# Patient Record
Sex: Female | Born: 1980 | Hispanic: Yes | Marital: Married | Smoking: Never smoker
Health system: Southern US, Community
[De-identification: ages and names within clinical notes are randomized; demographics above are authoritative.]

## PROBLEM LIST (undated history)

## (undated) DIAGNOSIS — O24419 Gestational diabetes mellitus in pregnancy, unspecified control: Secondary | ICD-10-CM

## (undated) DIAGNOSIS — R7612 Nonspecific reaction to cell mediated immunity measurement of gamma interferon antigen response without active tuberculosis: Secondary | ICD-10-CM

---

## 2018-09-19 LAB — OB RESULTS CONSOLE VARICELLA ZOSTER ANTIBODY, IGG: Varicella: IMMUNE

## 2018-09-19 LAB — OB RESULTS CONSOLE PLATELET COUNT: Platelets: 293

## 2018-09-19 LAB — OB RESULTS CONSOLE GC/CHLAMYDIA
Chlamydia: NEGATIVE
Gonorrhea: NEGATIVE

## 2018-09-19 LAB — OB RESULTS CONSOLE RPR: RPR: NONREACTIVE

## 2018-09-19 LAB — OB RESULTS CONSOLE TSH: TSH: 1.84

## 2018-09-19 LAB — OB RESULTS CONSOLE ANTIBODY SCREEN: Antibody Screen: NEGATIVE

## 2018-09-19 LAB — OB RESULTS CONSOLE TB SKIN TEST: CHL TB SkinTest: POSITIVE

## 2018-09-19 LAB — SICKLE CELL SCREEN: Sickle Cell Screen: NEGATIVE

## 2018-09-19 LAB — OB RESULTS CONSOLE ABO/RH: RH Type: POSITIVE

## 2018-09-19 LAB — OB RESULTS CONSOLE HGB/HCT, BLOOD: Hemoglobin: 12.1

## 2018-09-19 LAB — OB RESULTS CONSOLE HIV ANTIBODY (ROUTINE TESTING): HIV: NONREACTIVE

## 2018-09-19 LAB — OB RESULTS CONSOLE HEPATITIS B SURFACE ANTIGEN: Hepatitis B Surface Ag: NEGATIVE

## 2018-09-22 ENCOUNTER — Other Ambulatory Visit: Payer: Self-pay | Admitting: Family Medicine

## 2018-09-22 DIAGNOSIS — O09899 Supervision of other high risk pregnancies, unspecified trimester: Secondary | ICD-10-CM

## 2018-09-22 DIAGNOSIS — I10 Essential (primary) hypertension: Secondary | ICD-10-CM

## 2018-09-22 DIAGNOSIS — O09219 Supervision of pregnancy with history of pre-term labor, unspecified trimester: Secondary | ICD-10-CM

## 2018-09-22 DIAGNOSIS — R030 Elevated blood-pressure reading, without diagnosis of hypertension: Secondary | ICD-10-CM

## 2018-09-26 ENCOUNTER — Other Ambulatory Visit: Payer: Self-pay | Admitting: Family Medicine

## 2018-09-26 DIAGNOSIS — Z369 Encounter for antenatal screening, unspecified: Secondary | ICD-10-CM

## 2018-09-26 DIAGNOSIS — R7612 Nonspecific reaction to cell mediated immunity measurement of gamma interferon antigen response without active tuberculosis: Secondary | ICD-10-CM

## 2018-09-29 ENCOUNTER — Ambulatory Visit
Admission: RE | Admit: 2018-09-29 | Discharge: 2018-09-29 | Disposition: A | Discharge status: Home / Self Care | Payer: Self-pay | Source: Ambulatory Visit | Attending: Maternal & Fetal Medicine | Admitting: Maternal & Fetal Medicine

## 2018-09-29 ENCOUNTER — Other Ambulatory Visit: Payer: Self-pay

## 2018-09-29 DIAGNOSIS — O09521 Supervision of elderly multigravida, first trimester: Secondary | ICD-10-CM

## 2018-09-29 NOTE — Progress Notes (Signed)
Virtual Visit via Telephone Note  I connected with Summer Johnson on Sep 29, 2018 at 10:00 AM EDT by telephone and verified that I am speaking with the correct person using two identifiers.  Referring Provider:  Federico Flake Length of Consultation: 45 minutes  Summer Johnson was referred to [facility] for genetic counseling because of advanced maternal age.  The patient will be 38 years old at the time of delivery.  This note summarizes the information we discussed with the aid of a Spanish interpreter.    We explained that the chance of a chromosome abnormality increases with maternal age.  Chromosomes and examples of chromosome problems were reviewed.  Humans typically have 46 chromosomes in each cell, with half passed through each sperm and egg.  Any change in the number or structure of chromosomes can increase the risk of problems in the physical and mental development of a pregnancy.   Based upon age of the patient, the chance of any chromosome abnormality was 1 in 61. The chance of Down syndrome, the most common chromosome problem associated with maternal age, was 1 in 32.  The risk of chromosome problems is in addition to the 3% general population risk for birth defects and mental retardation.  The greatest chance, of course, is that the baby would be born in good health.  We discussed the following prenatal screening and testing options for this pregnancy:  Cell free fetal DNA testing from maternal blood may be used to determine whether or not the baby may have either Down syndrome, trisomy 44, or trisomy 53.  This test utilizes a maternal blood sample and DNA sequencing technology to isolate circulating cell free fetal DNA from maternal plasma.  The fetal DNA can then be analyzed for DNA sequences that are derived from the three most common chromosomes involved in aneuploidy, chromosomes 13, 18, and 21.  If the overall amount of DNA is greater than the expected  level for any of these chromosomes, aneuploidy is suspected.  While we do not consider it a replacement for invasive testing and karyotype analysis, a negative result from this testing would be reassuring, though not a guarantee of a normal chromosome complement for the baby.  An abnormal result is certainly suggestive of an abnormal chromosome complement, though we would still recommend CVS or amniocentesis to confirm any findings from this testing.  Targeted ultrasound uses high frequency sound waves to create an image of the developing fetus.  An ultrasound is often recommended as a routine means of evaluating the pregnancy.  It is also used to screen for fetal anatomy problems (for example, a heart defect) that might be suggestive of a chromosomal or other abnormality.   The chorionic villus sampling procedure is available for first trimester chromosome analysis.  This involves the withdrawal of a small amount of chorionic villi (tissue from the developing placenta).  Risk of pregnancy loss is estimated to be approximately 1 in 200 to 1 in 100 (0.5 to 1%).  There is approximately a 1% (1 in 100) chance that the CVS chromosome results will be unclear.  Chorionic villi cannot be tested for neural tube defects.     Amniocentesis involves the removal of a small amount of amniotic fluid from the sac surrounding the fetus with the use of a thin needle inserted through the maternal abdomen and uterus.  Ultrasound guidance is used throughout the procedure.  Fetal cells from amniotic fluid are directly evaluated and > 99.5%  of chromosome problems and > 98% of open neural tube defects can be detected. This procedure is generally performed after the 15th week of pregnancy.  The main risks to this procedure include complications leading to miscarriage in less than 1 in 200 cases (0.5%).  First trimester screening and maternal serum screening were not reviewed in detail, as they are not being offered during the  COVID-19 timeframe.  We do recommend at AFP only for neural tube defects in the second trimester.   Cystic Fibrosis and Spinal Muscular Atrophy (SMA) screening were also discussed with the patient. Both conditions are recessive, which means that both parents must be carriers in order to have a child with the disease.  Cystic fibrosis (CF) is one of the most common genetic conditions in persons of Caucasian ancestry.  This condition occurs in approximately 1 in 2,500 Caucasian persons and results in thickened secretions in the lungs, digestive, and reproductive systems.  For a baby to be at risk for having CF, both of the parents must be carriers for this condition.  Approximately 1 in 4125 Caucasian persons is a carrier for CF.  Current carrier testing looks for the most common mutations in the gene for CF and can detect approximately 90% of carriers in the Caucasian population.  This means that the carrier screening can greatly reduce, but cannot eliminate, the chance for an individual to have a child with CF.  If an individual is found to be a carrier for CF, then carrier testing would be available for the partner. As part of [place]'s newborn screening profile, all babies born in the state of [place]  will have a two-tier screening process.  Specimens are first tested to determine the concentration of immunoreactive trypsinogen (IRT).  The top 5% of specimens with the highest IRT values then undergo DNA testing using a panel of over 40 common CF mutations. SMA is a neurodegenerative disorder that leads to atrophy of skeletal muscle and overall weakness.  This condition is also more prevalent in the Caucasian population, with 1 in 40-1 in 60 persons being a carrier and 1 in 6,000-1 in 10,000 children being affected.  There are multiple forms of the disease, with some causing death in infancy to other forms with survival into adulthood.  The genetics of SMA is complex, but carrier screening can detect  up to 95% of carriers in the Caucasian population.  Similar to CF, a negative result can greatly reduce, but cannot eliminate, the chance to have a child with SMA. We also discussed the option of carrier screening for hemoglobinopathies.  The patient and her partner are of [place] ancestry.  We obtained a detailed family history and pregnancy history.  The family history is unremarkable for birth defects, developmental delays, recurrent pregnancy loss or known chromosome abnormalities.  Summer Johnson stated that this is the fourth pregnancy with her husband.  She reported no complications or exposures in this pregnancy that would be expected to increase the risk for birth defects.  After consideration of the options, Summer Johnson elected to proceed with an ultrasound only and to declined aneuploidy testing as well as carrier testing.  An ultrasound is scheduled for Monday, May 11 at 1[facility]00pm [facility] for dating and viability.  An ultrasound will also be scheduled at 18 weeks for anatomy.  Please refer to the ultrasound report for details of that study.  Summer Johnson was encouraged to call with questions or concerns.  We can be contacted  at [phone].   Tests Ordered: none - patient declined all testing other than ultrasound  I provided 45 minutes of non-face-to-face time during this encounter.   Katrina Stack

## 2018-10-03 ENCOUNTER — Other Ambulatory Visit: Payer: Self-pay

## 2018-10-03 ENCOUNTER — Ambulatory Visit: Payer: Self-pay

## 2018-10-03 ENCOUNTER — Ambulatory Visit
Admission: RE | Admit: 2018-10-03 | Discharge: 2018-10-03 | Disposition: A | Discharge status: Home / Self Care | Payer: Self-pay | Source: Ambulatory Visit | Attending: Maternal & Fetal Medicine | Admitting: Maternal & Fetal Medicine

## 2018-10-03 DIAGNOSIS — Z369 Encounter for antenatal screening, unspecified: Secondary | ICD-10-CM | POA: Insufficient documentation

## 2018-10-03 DIAGNOSIS — Z3A13 13 weeks gestation of pregnancy: Secondary | ICD-10-CM | POA: Insufficient documentation

## 2018-10-03 NOTE — Progress Notes (Signed)
Pt seen by me today, history reviewed by me, agree with assessment and plan as outlined in [facility]'s note.

## 2018-10-11 ENCOUNTER — Ambulatory Visit
Admission: RE | Admit: 2018-10-11 | Discharge: 2018-10-11 | Disposition: A | Discharge status: Home / Self Care | Payer: MEDICAID | Attending: Family Medicine | Admitting: Family Medicine

## 2018-10-11 ENCOUNTER — Ambulatory Visit
Admission: RE | Admit: 2018-10-11 | Discharge: 2018-10-11 | Disposition: A | Discharge status: Home / Self Care | Payer: MEDICAID | Source: Ambulatory Visit | Attending: Family Medicine | Admitting: Family Medicine

## 2018-10-11 DIAGNOSIS — R7612 Nonspecific reaction to cell mediated immunity measurement of gamma interferon antigen response without active tuberculosis: Secondary | ICD-10-CM

## 2018-11-03 ENCOUNTER — Other Ambulatory Visit: Payer: Self-pay

## 2018-11-03 DIAGNOSIS — O09521 Supervision of elderly multigravida, first trimester: Secondary | ICD-10-CM

## 2018-11-07 ENCOUNTER — Other Ambulatory Visit: Payer: Self-pay

## 2018-11-07 ENCOUNTER — Ambulatory Visit
Admission: RE | Admit: 2018-11-07 | Discharge: 2018-11-07 | Disposition: A | Discharge status: Home / Self Care | Payer: Medicaid Other | Source: Ambulatory Visit | Attending: Maternal & Fetal Medicine | Admitting: Maternal & Fetal Medicine

## 2018-11-07 DIAGNOSIS — Z3A18 18 weeks gestation of pregnancy: Secondary | ICD-10-CM | POA: Insufficient documentation

## 2018-11-07 DIAGNOSIS — O09522 Supervision of elderly multigravida, second trimester: Secondary | ICD-10-CM | POA: Diagnosis not present

## 2018-11-07 DIAGNOSIS — Z3689 Encounter for other specified antenatal screening: Secondary | ICD-10-CM | POA: Insufficient documentation

## 2018-11-07 DIAGNOSIS — O09521 Supervision of elderly multigravida, first trimester: Secondary | ICD-10-CM

## 2018-12-05 ENCOUNTER — Encounter: Payer: Self-pay | Admitting: Family Medicine

## 2018-12-05 DIAGNOSIS — Z6830 Body mass index (BMI) 30.0-30.9, adult: Secondary | ICD-10-CM | POA: Insufficient documentation

## 2018-12-05 DIAGNOSIS — R7612 Nonspecific reaction to cell mediated immunity measurement of gamma interferon antigen response without active tuberculosis: Secondary | ICD-10-CM | POA: Insufficient documentation

## 2018-12-05 DIAGNOSIS — E669 Obesity, unspecified: Secondary | ICD-10-CM | POA: Insufficient documentation

## 2018-12-05 DIAGNOSIS — O99210 Obesity complicating pregnancy, unspecified trimester: Secondary | ICD-10-CM

## 2018-12-05 DIAGNOSIS — O0990 Supervision of high risk pregnancy, unspecified, unspecified trimester: Secondary | ICD-10-CM | POA: Insufficient documentation

## 2019-01-03 ENCOUNTER — Other Ambulatory Visit: Payer: Self-pay

## 2019-01-03 ENCOUNTER — Observation Stay
Admission: EM | Admit: 2019-01-03 | Discharge: 2019-01-03 | Disposition: A | Discharge status: Home / Self Care | Payer: Medicaid Other | Attending: Obstetrics and Gynecology | Admitting: Obstetrics and Gynecology

## 2019-01-03 DIAGNOSIS — R102 Pelvic and perineal pain: Secondary | ICD-10-CM | POA: Insufficient documentation

## 2019-01-03 DIAGNOSIS — O26892 Other specified pregnancy related conditions, second trimester: Principal | ICD-10-CM | POA: Insufficient documentation

## 2019-01-03 DIAGNOSIS — Z3490 Encounter for supervision of normal pregnancy, unspecified, unspecified trimester: Secondary | ICD-10-CM

## 2019-01-03 DIAGNOSIS — O0990 Supervision of high risk pregnancy, unspecified, unspecified trimester: Secondary | ICD-10-CM

## 2019-01-03 DIAGNOSIS — O99210 Obesity complicating pregnancy, unspecified trimester: Secondary | ICD-10-CM

## 2019-01-03 DIAGNOSIS — Z3A26 26 weeks gestation of pregnancy: Secondary | ICD-10-CM | POA: Insufficient documentation

## 2019-01-03 LAB — URINALYSIS, ROUTINE W REFLEX MICROSCOPIC
Bilirubin Urine: NEGATIVE
Glucose, UA: NEGATIVE mg/dL
Hgb urine dipstick: NEGATIVE
Ketones, ur: NEGATIVE mg/dL
Leukocytes,Ua: NEGATIVE
Nitrite: NEGATIVE
Protein, ur: NEGATIVE mg/dL
Specific Gravity, Urine: 1.016 (ref 1.005–1.030)
pH: 7 (ref 5.0–8.0)

## 2019-01-03 NOTE — OB Triage Note (Signed)
Pt presents c/o frequent urination without pain or burning. She states that it felt like she was leaking fluid by itself. Reports having bloody mucous vaginal discharge. Denies any current bleeding. Reports lower abdominal cramps rating them 5/10. Last intercourse was 01/01/19. Reports positive fetal movement. Vitals WNL. Will continue to monitor. Reports increased vaginal pressure when walking as well.

## 2019-01-05 ENCOUNTER — Other Ambulatory Visit: Payer: Self-pay

## 2019-01-05 ENCOUNTER — Encounter: Payer: Self-pay | Admitting: Physician Assistant

## 2019-01-05 ENCOUNTER — Ambulatory Visit: Payer: Medicaid Other | Admitting: Physician Assistant

## 2019-01-05 VITALS — BP 95/72 | Temp 97.4°F | Wt 190.6 lb

## 2019-01-05 DIAGNOSIS — Z1331 Encounter for screening for depression: Secondary | ICD-10-CM | POA: Insufficient documentation

## 2019-01-05 DIAGNOSIS — O09522 Supervision of elderly multigravida, second trimester: Secondary | ICD-10-CM

## 2019-01-05 DIAGNOSIS — O99210 Obesity complicating pregnancy, unspecified trimester: Secondary | ICD-10-CM

## 2019-01-05 DIAGNOSIS — O0990 Supervision of high risk pregnancy, unspecified, unspecified trimester: Secondary | ICD-10-CM

## 2019-01-05 DIAGNOSIS — R7612 Nonspecific reaction to cell mediated immunity measurement of gamma interferon antigen response without active tuberculosis: Secondary | ICD-10-CM

## 2019-01-05 NOTE — Progress Notes (Signed)
Denies s/s or exposure to Covid-19. Taking PNV and ASA QD. PTL s/s written info today. Undecided regarding PP BCM, has info. L&D hospital visit 01/03/2019 for  lower abdominal pressure and pain that sometimes causes her difficulty in walking.  Hal Morales, RN

## 2019-01-05 NOTE — Progress Notes (Signed)
   PRENATAL VISIT NOTE  Subjective:  Summer Johnson is a 38 y.o. G4P3003 at [redacted]w[redacted]d being seen today for ongoing prenatal care.  She is currently monitored for the following issues for this high-risk pregnancy and has Advanced maternal age in multigravida, first trimester; Supervision of high risk pregnancy, antepartum; Obesity affecting pregnancy, antepartum; Morbid obesity (Jacobus); Positive QuantiFERON-TB Gold test; Pregnancy; and Depression screen on their problem list.  Patient reports occ urinary leakage.  Contractions: Not present. Vag. Bleeding: None.  Movement: Present. Denies leaking of fluid/ROM.   The following portions of the patient's history were reviewed and updated as appropriate: allergies, current medications, past family history, past medical history, past social history, past surgical history and problem list. Problem list updated.  Objective:   Vitals:   01/05/19 1440  BP: 95/72  Temp: (!) 97.4 F (36.3 C)  Weight: 190 lb 9.6 oz (86.5 kg)    Fetal Status: Fetal Heart Rate (bpm): 152 Fundal Height: 27 cm Movement: Present     General:  Alert, oriented and cooperative. Patient is in no acute distress.  Skin: Skin is warm and dry. No rash noted.         Abdomen: Soft, gravid, appropriate for gestational age.  Pain/Pressure: Absent     Pelvic: Cervical exam deferred        Extremities: Normal range of motion.  Edema: None  Mental Status: Normal mood and affect. Normal behavior. Normal judgment and thought content.   Assessment and Plan:  Pregnancy: G4P3003 at [redacted]w[redacted]d  1. Supervision of high risk pregnancy, antepartum Enc to keep 01/16/19 fetal US as sched to complete anatomical survey and evaluate growth. Enc po hydration, but avoid caffeinated beverages that might be irritating bladder/causing urinary frequency. Anticipatory guidance re: 28-wk labs.   2. Obesity affecting pregnancy, antepartum Continue aspirin, encouraged to take every day.  3. Positive QuantiFERON-TB  Gold test CXR neg; Offered treatment for LTBI - pt prefers to wait until postpartum  4. Depression screen PHQ-9 pos at score of 8. Offered counseling - pt declines for now, but will consider. Seems upbeat during visit.    Preterm labor symptoms and general obstetric precautions including but not limited to vaginal bleeding, contractions, leaking of fluid and fetal movement were reviewed in detail with the patient. Please refer to After Visit Summary for other counseling recommendations.  Return in about 2 weeks (around 01/19/2019) for Routine prenatal care, 28 wk labs.  Future Appointments  Date Time Provider [place]  01/16/2019  1:00 PM [facility] [place] [facility]  01/19/2019  1:40 PM [facility] PROVIDER [facility] None    Lora Havens, PA-C

## 2019-01-07 NOTE — Discharge Summary (Signed)
    L&D OB Triage Note  SUBJECTIVE Summer Johnson is a 38 y.o. [id number] female at [redacted]w[redacted]d, EDD Estimated Date of Delivery: 04/10/19 who presented to triage with complaints of pelvic pressure.  Possible leakage.   OB History  Gravida Para Term Preterm AB Living  4 3 3  0 0 3  SAB TAB Ectopic Multiple Live Births  0 0 0 0 3    # Outcome Date GA Lbr Len/2nd Weight Sex Delivery Anes PTL Lv  4 Current           3 Term 02/27/07 [redacted]w[redacted]d  3175 g F Vag-Spont   LIV     Name: Luanna Salk  2 Term 10/25/05 [redacted]w[redacted]d  [address]   LIV     Name: Diego  1 Term 09/07/03 [redacted]w[redacted]d  2722 g F Vag-Spont   LIV     Name: Raquel Sarna    No medications prior to admission.     OBJECTIVE  Nursing Evaluation:   BP 108/66 (BP Location: Left Arm)   Pulse 72   Temp 98.2 F (36.8 C) (Oral)   Resp 16   Ht 5' (1.524 m)   Wt 75.3 kg   LMP 07/04/2018   SpO2 99%   BMI 32.42 kg/m    Findings:   Rare contractions.  Not in labor.  Urine normal.  NST was performed and has been reviewed by me.  NST INTERPRETATION: Category I  Mode: External Baseline Rate (A): 135 bpm Variability: Moderate Accelerations: 15 x 15 Decelerations: None     Contraction Frequency (min): ctx x 2  ASSESSMENT Impression:  1.  Pregnancy:  [id number] at [redacted]w[redacted]d , EDD Estimated Date of Delivery: 04/10/19 2.  NST:  Category I  PLAN 1. Reassurance given 2. Discharge home with standard labor precautions given to return to L&D or call the office for problems. 3. Continue routine prenatal care.

## 2019-01-12 ENCOUNTER — Other Ambulatory Visit: Payer: Self-pay

## 2019-01-12 ENCOUNTER — Other Ambulatory Visit: Payer: Self-pay | Admitting: Maternal & Fetal Medicine

## 2019-01-12 DIAGNOSIS — R638 Other symptoms and signs concerning food and fluid intake: Secondary | ICD-10-CM

## 2019-01-16 ENCOUNTER — Other Ambulatory Visit: Payer: Self-pay

## 2019-01-16 ENCOUNTER — Inpatient Hospital Stay
Admission: RE | Admit: 2019-01-16 | Discharge: 2019-01-16 | DRG: 832 | Disposition: A | Discharge status: Other Acute Inpatient Hospital | Payer: Medicaid Other | Attending: Obstetrics and Gynecology | Admitting: Obstetrics and Gynecology

## 2019-01-16 ENCOUNTER — Ambulatory Visit
Admission: RE | Admit: 2019-01-16 | Discharge: 2019-01-16 | Disposition: A | Discharge status: Home / Self Care | Payer: Medicaid Other | Source: Ambulatory Visit | Attending: Obstetrics and Gynecology | Admitting: Obstetrics and Gynecology

## 2019-01-16 DIAGNOSIS — R638 Other symptoms and signs concerning food and fluid intake: Secondary | ICD-10-CM

## 2019-01-16 DIAGNOSIS — Z20828 Contact with and (suspected) exposure to other viral communicable diseases: Secondary | ICD-10-CM | POA: Diagnosis present

## 2019-01-16 DIAGNOSIS — O99213 Obesity complicating pregnancy, third trimester: Secondary | ICD-10-CM | POA: Diagnosis present

## 2019-01-16 DIAGNOSIS — Z3A28 28 weeks gestation of pregnancy: Secondary | ICD-10-CM | POA: Diagnosis not present

## 2019-01-16 DIAGNOSIS — O42913 Preterm premature rupture of membranes, unspecified as to length of time between rupture and onset of labor, third trimester: Principal | ICD-10-CM | POA: Diagnosis present

## 2019-01-16 DIAGNOSIS — E669 Obesity, unspecified: Secondary | ICD-10-CM | POA: Diagnosis present

## 2019-01-16 DIAGNOSIS — O09523 Supervision of elderly multigravida, third trimester: Secondary | ICD-10-CM

## 2019-01-16 DIAGNOSIS — O321XX0 Maternal care for breech presentation, not applicable or unspecified: Secondary | ICD-10-CM | POA: Diagnosis present

## 2019-01-16 DIAGNOSIS — O4103X0 Oligohydramnios, third trimester, not applicable or unspecified: Secondary | ICD-10-CM | POA: Diagnosis present

## 2019-01-16 LAB — CBC
HCT: 36.1 % (ref 36.0–46.0)
Hemoglobin: 12 g/dL (ref 12.0–15.0)
MCH: 28.8 pg (ref 26.0–34.0)
MCHC: 33.2 g/dL (ref 30.0–36.0)
MCV: 86.6 fL (ref 80.0–100.0)
Platelets: 276 10*3/uL (ref 150–400)
RBC: 4.17 MIL/uL (ref 3.87–5.11)
RDW: 12.8 % (ref 11.5–15.5)
WBC: 9.1 10*3/uL (ref 4.0–10.5)
nRBC: 0 % (ref 0.0–0.2)

## 2019-01-16 LAB — TYPE AND SCREEN
ABO/RH(D): B POS
Antibody Screen: NEGATIVE

## 2019-01-16 LAB — CHLAMYDIA/NGC RT PCR (ARMC ONLY)
Chlamydia Tr: NOT DETECTED
N gonorrhoeae: NOT DETECTED

## 2019-01-16 LAB — MAGNESIUM: Magnesium: 3.9 mg/dL — ABNORMAL HIGH (ref 1.7–2.4)

## 2019-01-16 LAB — GROUP B STREP BY PCR: Group B strep by PCR: NEGATIVE

## 2019-01-16 LAB — SARS CORONAVIRUS 2 BY RT PCR (HOSPITAL ORDER, PERFORMED IN ~~LOC~~ HOSPITAL LAB): SARS Coronavirus 2: NEGATIVE

## 2019-01-16 MED ORDER — ACETAMINOPHEN 325 MG PO TABS: 650.0000 mg | ORAL_TABLET | ORAL | Status: DC | PRN

## 2019-01-16 MED ORDER — LACTATED RINGERS IV SOLN
INTRAVENOUS | Status: DC
  Administered 2019-01-16: 15:00:00 via INTRAVENOUS

## 2019-01-16 MED ORDER — MAGNESIUM SULFATE 40 G IN LACTATED RINGERS - SIMPLE
2.0000 g/h | INTRAVENOUS | Status: DC
  Filled 2019-01-16: qty 500

## 2019-01-16 MED ORDER — MAGNESIUM SULFATE BOLUS VIA INFUSION
4.0000 g | Freq: Once | INTRAVENOUS | Status: AC
  Administered 2019-01-16: 4 g via INTRAVENOUS
  Filled 2019-01-16: qty 500

## 2019-01-16 MED ORDER — AZITHROMYCIN 500 MG PO TABS
1000.0000 mg | ORAL_TABLET | Freq: Once | ORAL | Status: AC
  Administered 2019-01-16: 1000 mg via ORAL
  Filled 2019-01-16: qty 2

## 2019-01-16 MED ORDER — SOD CITRATE-CITRIC ACID 500-334 MG/5ML PO SOLN: 30.0000 mL | ORAL | Status: DC | PRN

## 2019-01-16 MED ORDER — LACTATED RINGERS IV SOLN: 500.0000 mL | INTRAVENOUS | Status: DC | PRN

## 2019-01-16 MED ORDER — OXYTOCIN 40 UNITS IN NORMAL SALINE INFUSION - SIMPLE MED: 2.5000 [IU]/h | INTRAVENOUS | Status: DC

## 2019-01-16 MED ORDER — OXYTOCIN BOLUS FROM INFUSION: 500.0000 mL | Freq: Once | INTRAVENOUS | Status: DC

## 2019-01-16 MED ORDER — AMOXICILLIN 500 MG PO CAPS
500.0000 mg | ORAL_CAPSULE | Freq: Three times a day (TID) | ORAL | Status: DC
  Filled 2019-01-16: qty 1

## 2019-01-16 MED ORDER — ONDANSETRON HCL 4 MG/2ML IJ SOLN: 4.0000 mg | Freq: Four times a day (QID) | INTRAMUSCULAR | Status: DC | PRN

## 2019-01-16 MED ORDER — SODIUM CHLORIDE 0.9 % IV SOLN
2.0000 g | Freq: Four times a day (QID) | INTRAVENOUS | Status: DC
  Administered 2019-01-16: 2 g via INTRAVENOUS
  Filled 2019-01-16: qty 2000

## 2019-01-16 MED ORDER — BETAMETHASONE SOD PHOS & ACET 6 (3-3) MG/ML IJ SUSP
INTRAMUSCULAR | Status: AC
  Filled 2019-01-16: qty 5

## 2019-01-16 MED ORDER — BETAMETHASONE SOD PHOS & ACET 6 (3-3) MG/ML IJ SUSP
12.0000 mg | INTRAMUSCULAR | Status: DC
  Administered 2019-01-16: 15:00:00 12 mg via INTRAMUSCULAR

## 2019-01-16 MED ORDER — LIDOCAINE HCL (PF) 1 % IJ SOLN: 30.0000 mL | INTRAMUSCULAR | Status: DC | PRN

## 2019-01-16 NOTE — Discharge Summary (Signed)
[name] MRN: [id number] DOB/AGE: Jan 09, 1981 38 y.o.  Admit date: 01/16/2019 Discharge date: 01/16/2019  Admission Diagnoses: PPROM  Discharge Diagnoses: PPROM  Prenatal Procedures: NST and ultrasound  Consults: Neonatology, Maternal Fetal Medicine  Preg c/b: 1. PPROM on 01/08/19 per pt. No fluid measurable on growth [place].  2. Advanced maternal age 38. Incomplete anatomy scan for sacral views- repeat US 01/16/19: breech, no fluid, EFW 1100grams 4. Obesity, BMI 39; on baby ASA daily  Significant Diagnostic Studies:  Results for orders placed or performed during the hospital encounter of 01/16/19 (from the past 168 hour(s))  CBC   Collection Time: 01/16/19  2:18 PM  Result Value Ref Range   WBC 9.1 4.0 - 10.5 K/uL   RBC 4.17 3.87 - 5.11 MIL/uL   Hemoglobin 12.0 12.0 - 15.0 g/dL   HCT 36.1 36.0 - 46.0 %   MCV 86.6 80.0 - 100.0 fL   MCH 28.8 26.0 - 34.0 pg   MCHC 33.2 30.0 - 36.0 g/dL   RDW 12.8 11.5 - 15.5 %   Platelets 276 150 - 400 K/uL   nRBC 0.0 0.0 - 0.2 %  Type and screen [place]   Collection Time: 01/16/19  2:18 PM  Result Value Ref Range   ABO/RH(D) B POS    Antibody Screen NEG    Sample Expiration      01/19/2019,2359 Performed at [facility], [address] [place], [place] [address]   SARS Coronavirus 2 [facility] order, Performed in [place] hospital lab) Nasopharyngeal Nasopharyngeal Swab   Collection Time: 01/16/19  2:36 PM   Specimen: Nasopharyngeal Swab  Result Value Ref Range   SARS Coronavirus 2 NEGATIVE NEGATIVE  Group B strep by PCR   Collection Time: 01/16/19  2:50 PM   Specimen: Nasopharyngeal Swab; Genital  Result Value Ref Range   Group B strep by PCR NEGATIVE NEGATIVE    Treatments: antibiotics: azithromycin and Ampicillin, steroids: Betamethasone and Magnesium for neuroprotection  Hospital Course:  This is a 38 y.o. [id number] with IUP at [redacted]w[redacted]d admitted for PPROM on approx 8/16 occurring at  [redacted]w[redacted]d. SSE was performed, confirmed ROM.  She was initially started on magnesium sulfate for neuroprotection and also received betamethasone x 2 doses.  Her tocolysis was transitioned to Procardia. She was seen by Neonatology during her stay.  She was observed, fetal heart rate monitoring remained reassuring, and she had no signs/symptoms of progressing preterm labor or other maternal-fetal concerns.  Her cervical exam was unchanged from admission.  She was deemed stable for discharge to home with outpatient follow up.  Discharge Physical Exam:  BP 131/73   Pulse (!) 152   LMP 07/04/2018   General: NAD CV: RRR Pulm: CTABL, nl effort ABD: s/nd/nt, gravid DVT Evaluation: LE non-ttp, no evidence of DVT on exam. SSE: + pooling, clear fluid- spec exam not repeated at DC.  SVE: FTP/long/soft/posterior FHT: Cat I tracing, 130bpm, mod variability, + accels, no decels TOCO: irreg occasional Uc noted.    Discharge Condition: Stable for transport  Disposition: transfer to [place] via [facility] transport   Allergies as of 01/16/2019   No Known Allergies     Medication List    STOP taking these medications   aspirin EC 81 MG tablet   prenatal multivitamin Tabs tablet        Signed:  Francetta Found, CNM 01/16/2019  4:12 PM

## 2019-01-16 NOTE — OB Triage Note (Signed)
Pt sent up from [facility], 28 wk, LOF, U/S confirms no fluid and breech presentation.

## 2019-01-16 NOTE — H&P (Signed)
OB History & Physical   History of Present Illness:  Chief Complaint: sent from MFM due to low fluid.   HPI:  Summer Johnson is a 38 y.o. [phone] female at 68w0ddated by LMP and c/w 13wks [place]  She presents to L&D for oligohydramnios on [place] at MFM today.  - pt reports leaking fluid starting 8 days ago, felt that it was normal.  - seen today for routine growth [place] MFM, no measurable fluid noted, fetus breech.  - reports active FM, no VB.  - reports some cramping about 1.5wks ago, but none currently or since her water broke.   Pregnancy Issues: 1. PPROM on 01/08/19 per pt. No fluid measurable on growth [place]  2. Advanced maternal age 38 Incomplete anatomy scan for sacral views- repeat [place]8/24/20: breech, no fluid, EFW 1100grams 4. Obesity, BMI 39; on baby ASA daily   Maternal Medical History:  No past medical history on file.  No past surgical history on file.  No Known Allergies  Prior to Admission medications   Medication Sig Start Date End Date Taking? Authorizing Provider  aspirin EC 81 MG tablet Take 81 mg by mouth daily.    [provider]  Prenatal Vit-Fe Fumarate-FA (PRENATAL MULTIVITAMIN) TABS tablet Take 1 tablet by mouth daily at 12 noon.    [provider]   OB History  Gravida Para Term Preterm AB Living  _0 SAB TAB Ectopic Multiple Live Births          3    # Outcome Date GA Lbr Len/2nd Weight Sex Delivery Anes PTL Lv  4 Current           3 Term 02/27/07 [id number] g F Vag-Spont   LIV  2 Term 10/25/05 [id number] g M Vag-Spont   LIV  1 Term 09/07/03 40w29w0d22 g F Vag-Spont   LIV     [facility]   Social History: She  reports that she has never smoked. She has never used smokeless tobacco. She reports that she does not drink alcohol or use drugs.  Family History: family history includes Diabetes in her mother and sister; Hypertension in her mother.   Review of Systems: A full review of  systems was performed and negative except as noted in the HPI.     Physical Exam:  Vital Signs: BP 105/60 (BP Location: Right Arm)   Pulse 82   LMP 07/04/2018  General: no acute distress.  HEENT: normocephalic, atraumatic Heart: regular rate & rhythm.  No murmurs/rubs/gallops Lungs: clear to auscultation bilaterally, normal respiratory effort Abdomen: soft, gravid, non-tender;  EFW: 1100 grams by US t[place]ay.  Pelvic: SSE done: copious amt clear fluid, + pooling.   External: Normal external female genitalia  Cervix: lax vaginal tone noted, cervix digitally checked: FT/long, soft.    Extremities: non-tender, symmetric, no edema bilaterally.  DTRs: 2+  Neurologic: Alert & oriented x 3.    Results for orders placed or performed during the hospital encounter of 01/16/19 (from the past 24 hour(s))  CBC     Status: None   Collection Time: 01/16/19  2:18 PM  Result Value Ref Range   WBC 9.1 4.0 - 10.5 K/uL   RBC 4.17 3.87 - 5.11 MIL/uL   Hemoglobin 12.0 12.0 - 15.0 g/dL   HCT 36.1 36.0 - 46.0 %   MCV 86.6 80.0 - 100.0 fL  MCH 28.8 26.0 - 34.0 pg   MCHC 33.2 30.0 - 36.0 g/dL   RDW 12.8 11.5 - 15.5 %   Platelets 276 150 - 400 K/uL   nRBC 0.0 0.0 - 0.2 %    Pertinent Results:  Prenatal Labs: Blood type/Rh  B Pos  Antibody screen neg  Rubella  MMR x 2  Varicella Immune  RPR NR  HBsAg Neg  HIV NR  GC/CT  8/24- pending  Genetic screening  quad screen neg 11/11/18  1 hour GTT  early GTT 87  GBS  8/24- pending  S/p flu vaccine: 09/19/18 Tdap: not given this preg  FHT: 135bpm, moderate variability, + accels, no decels TOCO: Irreg Occasional UCs.    Breech by [place]  [place] Mfm Ob Follow Up  Result Date: 01/16/2019 ----------------------------------------------------------------------  OBSTETRICS REPORT                       (Signed Final 01/16/2019 02:09 pm) ---------------------------------------------------------------------- PATIENT INFO:  ID #:       [id number]                           D.O.B.:  [birth year] (37 yrs)  Name:       Summer Johnson                     Visit Date: 01/16/2019 01:51 pm ---------------------------------------------------------------------- PERFORMED BY:  Performed By:     Marinus Maw            Secondary Phy.:   ELIZABETH A                    Sonographer                                                             [place] CNM  Referred By:      Real Cons            Address:          [address],                                                             [place], [place]  Ref. Address:     [address]                    [place], [place] ---------------------------------------------------------------------- SERVICE(S) PROVIDED:   [place] MFM OB FOLLOW UP                                  [phone]  ---------------------------------------------------------------------- INDICATIONS:   [redacted] weeks gestation of pregnancy                Z3A.28  ---------------------------------------------------------------------- FETAL EVALUATION:  Num Of Fetuses:         1  Fetal Heart Rate(bpm):  135  Cardiac Activity:       Present  Fetal Lie:              Maternal Left  Presentation:           Breech ; Frank  Placenta:               Anterior Grade 0 No previa  Amniotic Fluid  AFI FV:      Oligohydramnios  AFI Sum(cm)     %Tile       Largest Pocket(cm)  1.47            < 3         1.47  RUQ(cm)  1.47  Comment:    There is a single 1.5 x 1.1 pocket ---------------------------------------------------------------------- BIOMETRY:  BPD:      69.1  mm     G. Age:  27w 5d         31  %    CI:        68.23   %    70 - 86                                                          FL/HC:      18.8   %    18.8 - 20.6  HC:      267.5  mm     G. Age:  29w 1d         55  %    HC/AC:      1.14         1.05 - 1.21  AC:      235.1  mm     G. Age:  27w 6d         38  %    FL/BPD:     72.6   %    71 - 87  FL:       50.2  mm     G. Age:  27w 0d         12  %    FL/AC:      21.4   %    20 - 24  HUM:      50.3  mm     G. Age:  29w 3d         32  %  Est. FW:    1108  gm      2 lb 7 oz     30  % ---------------------------------------------------------------------- GESTATIONAL AGE:  LMP:           28w 0d        Date:  07/04/18                 EDD:   04/10/19  U/S Today:     28w 0d                                        EDD:   04/10/19  Best:          28w 0d     Det. By:  LMP  (07/04/18)          EDD:   04/10/19 ---------------------------------------------------------------------- ANATOMY:  Cranium:               Normal appearance      Stomach:                Seen  Cavum:                 Normal appearance      Kidneys:                Normal appearance  Ventricles:            Normal appearance      Bladder:                Seen  Heart:                 Normal appearance      Spine:                  Normal appearance  Aortic Arch:           Normal appearance ---------------------------------------------------------------------- CERVIX UTERUS ADNEXA:  Cervix  Length:           3.45  cm. ---------------------------------------------------------------------- IMPRESSION:  Dear Ms. SCIORA}  ,  Thank you for referring your patient  for a fetal growth  evaluation due to AMA, obesity and to follow up spine views  that were not seen before.  There is a singleton gestation at 30w 0d dated by earliest  scan done at [facility] at [place] 1d on 10/03/2018.  There is severe oligohydramnios with a single 1.5 x 1.1 cm  pocket  Fetus is in the frank breech position. spine is normal  appearing  , kidneys, stomach and bladder were imaged and  appeared normal .  Postive fetal movement and tone.  The fetal biometry correlates with established dating.  Adequate interval growth noted. 1100 g  The estimated fetal weight is at the  30%ile      percentile. AGA  Using an ipad interpreter the patient reports a daily discharge  for which she uses a pad. She does not report a gush but a  daily discharge for about a week. She denies any fever or  pain. She notes intermittent pressure on her bladder that  cause her to urinate.  I reviewed my concern for premature preterm rupture of  membranes and recommended she go to L&D for a sterile  speculum exam . If confirmed , I reviewed with her the typical  management with Latency antibiotics, betamethasone and  expectant care until  34 weeks with careful observation for  infection inthe hospital. I told her the fetus is breech and that  the chance for spontaneous version  is less likely with  oligohydramnios. I reviewed at this gestational age she will  likely require transfer to a tertiary care center with a NICU  that cares for 28 week neonates if delviery is indicated.  Patient transferred [facility] . ----------------------------------------------------------------------              Gatha Mayer, MD Electronically Signed Final Report   01/16/2019 02:09 pm ----------------------------------------------------------------------   Assessment:  Summer Johnson is a 38 y.o. [phone] female at 54w0dwith PPROM.   Plan:  1. Admit to Labor & Delivery with plan to transfer to higher level of care for NICU bed; consents reviewed and obtained - COVID test pending - spec exam with confirmed PPROM, GBS and GC/CT done.  - consult with Dr BLeafy Ro latency abx ordered, IM betamethasone ordered, Mag for neuroprotection ordered.  - transfer of care planned, accepted by [facility], Dr JLuevenia Maxin8/24 at 1450; room 5420.   2. Fetal Well being  - Fetal Tracing: Cat I - Group B Streptococcus ppx indicated: latency abx ordered: IV Amp and PO Azithro.  - Presentation: breech confirmed by [place]  3. Routine OB: - Prenatal labs reviewed, as above - Rh B Pos - CBC, T&S, RPR on admit - Clear fluids, IVF    RFrancetta Found CNM 01/16/19 2:59 PM

## 2019-01-16 NOTE — ED Notes (Signed)
Pt came to [facility] for routine scheduled [place] at 1300.  VSS.  Interpreter utilized virtually. History completed and [place] started.  No measurable fluid noted per Robyn Haber.  Pt stated that she starting leaking fluid 8 days ago. She is an [facility] pt and had not contacted Health Department when fluid started leaking.  Pt has had no fever.  Only taking PNV and Aspirin for meds. Dr. Lehman Prom MFM order to [facility].  Pt transported via wheelchair to OBS 4 and bedside report given to Reece Leader, RN  regarding pt care plan up to this point. Pt in OBS 4 @ 1356.

## 2019-01-17 LAB — RPR: RPR Ser Ql: NONREACTIVE

## 2019-01-19 ENCOUNTER — Ambulatory Visit: Payer: Medicaid Other

## 2019-01-23 DIAGNOSIS — O321XX0 Maternal care for breech presentation, not applicable or unspecified: Secondary | ICD-10-CM | POA: Insufficient documentation

## 2019-01-23 DIAGNOSIS — O42919 Preterm premature rupture of membranes, unspecified as to length of time between rupture and onset of labor, unspecified trimester: Secondary | ICD-10-CM | POA: Insufficient documentation

## 2019-02-13 ENCOUNTER — Encounter: Payer: Self-pay | Admitting: Family Medicine

## 2019-02-13 ENCOUNTER — Telehealth: Payer: Self-pay

## 2019-02-13 DIAGNOSIS — O4290 Premature rupture of membranes, unspecified as to length of time between rupture and onset of labor, unspecified weeks of gestation: Secondary | ICD-10-CM | POA: Insufficient documentation

## 2019-02-13 DIAGNOSIS — Z98891 History of uterine scar from previous surgery: Secondary | ICD-10-CM | POA: Insufficient documentation

## 2019-02-13 DIAGNOSIS — O24419 Gestational diabetes mellitus in pregnancy, unspecified control: Secondary | ICD-10-CM | POA: Insufficient documentation

## 2019-02-13 NOTE — Telephone Encounter (Signed)
Call to client to notify her Dr. Ernestina Patches completed [place] form and ready fpr pick up from [facility]. Per client, she will be here 02/14/2019 around 8 am. Form in envelope on [facility] nurwse to do cart. Rich Number, RN

## 2019-02-13 NOTE — Telephone Encounter (Signed)
Language Line utilized during call (ID # [id number]). Rich Number, RN

## 2019-08-11 ENCOUNTER — Ambulatory Visit: Payer: Medicaid Other | Attending: Internal Medicine

## 2019-08-11 ENCOUNTER — Other Ambulatory Visit: Payer: Self-pay

## 2019-08-11 DIAGNOSIS — Z23 Encounter for immunization: Secondary | ICD-10-CM

## 2019-08-11 NOTE — Progress Notes (Signed)
   Covid-19 Vaccination Clinic  Name:  Summer Johnson    MRN: [id number] DOB: 1980/10/16  08/11/2019  [name] was observed post Covid-19 immunization for 15 minutes without incident. She was provided with Vaccine Information Sheet and instruction to access the V-Safe system.   [name] was instructed to call 911 with any severe reactions post vaccine: Marland Kitchen Difficulty breathing  . Swelling of face and throat  . A fast heartbeat  . A bad rash all over body  . Dizziness and weakness   Immunizations Administered    Name Date Dose VIS Date Route   Pfizer COVID-19 Vaccine 08/11/2019  3:56 PM 0.3 mL 05/05/2019 Intramuscular   Manufacturer: [employer], [employer]   Lot: [id number]   NDC: [address]-3

## 2019-08-24 ENCOUNTER — Encounter: Payer: Self-pay | Admitting: Physician Assistant

## 2019-08-24 ENCOUNTER — Ambulatory Visit (LOCAL_COMMUNITY_HEALTH_CENTER): Payer: Medicaid Other | Admitting: Physician Assistant

## 2019-08-24 ENCOUNTER — Other Ambulatory Visit: Payer: Self-pay

## 2019-08-24 VITALS — BP 115/76 | Ht 58.75 in | Wt 199.4 lb

## 2019-08-24 DIAGNOSIS — R7612 Nonspecific reaction to cell mediated immunity measurement of gamma interferon antigen response without active tuberculosis: Secondary | ICD-10-CM | POA: Diagnosis not present

## 2019-08-24 DIAGNOSIS — Z3009 Encounter for other general counseling and advice on contraception: Secondary | ICD-10-CM

## 2019-08-24 DIAGNOSIS — B354 Tinea corporis: Secondary | ICD-10-CM | POA: Diagnosis not present

## 2019-08-24 DIAGNOSIS — Z309 Encounter for contraceptive management, unspecified: Secondary | ICD-10-CM

## 2019-08-24 MED ORDER — CLOTRIMAZOLE 1 % EX CREA: 1.0000 "application " | TOPICAL_CREAM | Freq: Two times a day (BID) | CUTANEOUS | 0 refills | Status: DC

## 2019-08-24 NOTE — Progress Notes (Signed)
   [place] problem visit  [place] Department  Subjective:  Summer Johnson is a 39 y.o. being seen today for   Chief Complaint  Patient presents with  . Contraception    C/S check    39 yo [phone] here for C/S incision check. Had IOL at 31 0/7 wk on 02/06/20 due to PPROM. Breast & bottle feeding. States she is worried her incision is opening, as she saw some drainage on her clothing. Has some local itching, but no pain, warmth. Of note had GDM, and did not have a postpartum visit. Got Nexplanon prior to ob discharge. Reports infant is thriving.    Does the patient have a current or past history of drug use? No   No components found for: HCV]   Health Maintenance Due  Topic Date Due  . PAP SMEAR-Modifier  Never done    ROS  The following portions of the patient's history were reviewed and updated as appropriate: allergies, current medications, past family history, past medical history, past social history, past surgical history and problem list. Problem list updated.   See flowsheet for other program required questions.  Objective:   Vitals:   08/24/19 1532  BP: 115/76  Weight: 199 lb 6.4 oz (90.4 kg)  Height: 4' 10.75" (1.492 m)    Physical Exam Constitutional:      Appearance: She is obese.  Pulmonary:     Effort: Pulmonary effort is normal.  Abdominal:     Palpations: Abdomen is soft.     Tenderness: There is no abdominal tenderness. There is no guarding or rebound.     Hernia: No hernia is present.     Comments: Horiz suprapubic incision site appears well healed, no drainage or dehiscence. Area around incision site with well-demarcated patches of erythema, some skin breakdown/maceration. No induration.  Neurological:     Mental Status: She is alert and oriented to person, place, and time.  Psychiatric:        Behavior: Behavior normal.        Thought Content: Thought content normal.        Judgment: Judgment normal.        Assessment and Plan:  Summer Johnson is a 39 y.o. female presenting to the [facility] Department for a Women's Health problem visit  1. Tinea corporis Rash around C/S incision site c/w tinea corporis in intertriginous area under pannus. Incision intact without signs of infection. - clotrimazole (CLOTRIMAZOLE AF) 1 % cream; Apply 1 application topically 2 (two) times daily.  Dispense: 30 g; Refill: 0  2. Encounter for contraceptive management, unspecified type Continue Nexplanon. Schedule F/U visit for Pap and 2h oGTT due to h/o GDM. Met with CD nurse today re: pos Quantiferon Gold TB test.     Return in about 1 month (around 09/23/2019) for 2h OGTT for f/u GDM from 2020 pregnancy.  Future Appointments  Date Time Provider [place]  09/01/2019  8:20 AM AC-FP NURSE AC-FAM None  09/01/2019  3:15 PM [facility] PEC-PEC PEC    Lora Havens, PA-C

## 2019-08-24 NOTE — Progress Notes (Signed)
In to have C/S incision checked-delivered 02/06/20 @ [facility] and had Nexplanon placed prior to discharge; declines HIV/RPR testing Sharlette Dense, RN Per provider-Clotrimazole 1%cream given & discussed 2 Hr Gtt appt 09/01/19-instructions given Sharlette Dense, RN

## 2019-08-28 NOTE — Progress Notes (Signed)
Discussed previous +QFT  (May 2020) with patient. Patient originally declined LTBI tx during pregnancy until after delivery, but after discussing LTBI tx now patient declines at this time.  Declination signed but TB RN co to call if decides to proceed with tx in the future. Summer Campbell, RN

## 2019-09-01 ENCOUNTER — Ambulatory Visit: Payer: Medicaid Other | Attending: Internal Medicine

## 2019-09-01 ENCOUNTER — Other Ambulatory Visit: Payer: Self-pay

## 2019-09-01 ENCOUNTER — Other Ambulatory Visit: Payer: Medicaid Other

## 2019-09-01 DIAGNOSIS — O24439 Gestational diabetes mellitus in the puerperium, unspecified control: Secondary | ICD-10-CM

## 2019-09-01 DIAGNOSIS — Z23 Encounter for immunization: Secondary | ICD-10-CM

## 2019-09-01 NOTE — Progress Notes (Signed)
Pt to clinic for 2 hour glucose test; see Landry Dyke, PA order dated 08/24/2019. Pt states the last time she had anything to eat or drink anything, other than small sips of water, was last night at 9:00 pm. Pt given instructions and sent to lab.

## 2019-09-01 NOTE — Progress Notes (Signed)
   Covid-19 Vaccination Clinic  Name:  Summer Johnson    MRN: [id number] DOB: March 20, 1981  09/01/2019  [name] was observed post Covid-19 immunization for 15 minutes without incident. She was provided with Vaccine Information Sheet and instruction to access the V-Safe system.   [name] was instructed to call 911 with any severe reactions post vaccine: Marland Kitchen Difficulty breathing  . Swelling of face and throat  . A fast heartbeat  . A bad rash all over body  . Dizziness and weakness   Immunizations Administered    Name Date Dose VIS Date Route   Pfizer COVID-19 Vaccine 09/01/2019  3:18 PM 0.3 mL 05/05/2019 Intramuscular   Manufacturer: [employer], [employer]   Lot: [id number]   NDC: [address]-9

## 2019-09-02 LAB — GLUCOSE TOLERANCE, 2 HOURS
Glucose, 2 hour: 92 mg/dL (ref 65–139)
Glucose, GTT - Fasting: 89 mg/dL (ref 65–99)

## 2020-12-14 IMAGING — US US MFM OB FOLLOW UP
1 series · 12 of 28 positions shown · non-contrast
Comparison: none

PATIENT INFO:

PERFORMED BY:
 Performed By:     Nya Jumper            Secondary Phy.:   SAVIO LOCKLEAR
                   Sonographer
                                                            [place] CNM
 Referred By:      SAVIO LOCKLEAR            Address:          [address]
                   RB CNM
 Ref. Address:     [address]
                   [id number]
SERVICE(S) PROVIDED:
 ----------------------------------------------------------------------
INDICATIONS:
  28 weeks gestation of pregnancy
FETAL EVALUATION:
 Num Of Fetuses:         1
 Fetal Heart Rate(bpm):  135
 Cardiac Activity:       Present
 Fetal Lie:              Maternal Left
 Presentation:           Breech ; Frank
 Placenta:               Anterior Grade 0 No previa
 Amniotic Fluid
 AFI FV:      Oligohydramnios
 AFI Sum(cm)     %Tile       Largest Pocket(cm)
 1.47            < 3
 RUQ(cm)
 Comment:    There is a single 1.5 x 1.1 pocket
BIOMETRY:
 BPD:      69.1  mm     G. Age:  27w 5d         31  %    CI:        68.23   %    70 - 86
                                                         FL/HC:      18.8   %    18.8 -
 HC:      267.5  mm     G. Age:  29w 1d         55  %    HC/AC:      1.14        1.05 -
 AC:      235.1  mm     G. Age:  27w 6d         38  %    FL/BPD:     72.6   %    71 - 87
 FL:       50.2  mm     G. Age:  27w 0d         12  %    FL/AC:      21.4   %    20 - 24
 HUM:      50.3  mm     G. Age:  29w 3d         76  %
 Est. FW:    6689  gm      2 lb 7 oz     30  %
GESTATIONAL AGE:
 LMP:           28w 0d        Date:  07/04/18                 EDD:   04/10/19
 U/S Today:     28w 0d                                        EDD:   04/10/19
 Best:          28w 0d     Det. By:  LMP  (07/04/18)          EDD:   04/10/19
ANATOMY:
 Cranium:               Normal appearance      Stomach:                Seen
 Cavum:                 Normal appearance      Kidneys:                Normal appearance
 Ventricles:            Normal appearance      Bladder:                Seen
 Heart:                 Normal appearance      Spine:                  Normal appearance
 Aortic Arch:           Normal appearance
CERVIX UTERUS ADNEXA:
 Cervix
 Length:           3.45  cm.

[Series 1: us mfm ob follow up · 0.25mm/px · 35 acquisitions, 12 frames shown]
[im 2/35]
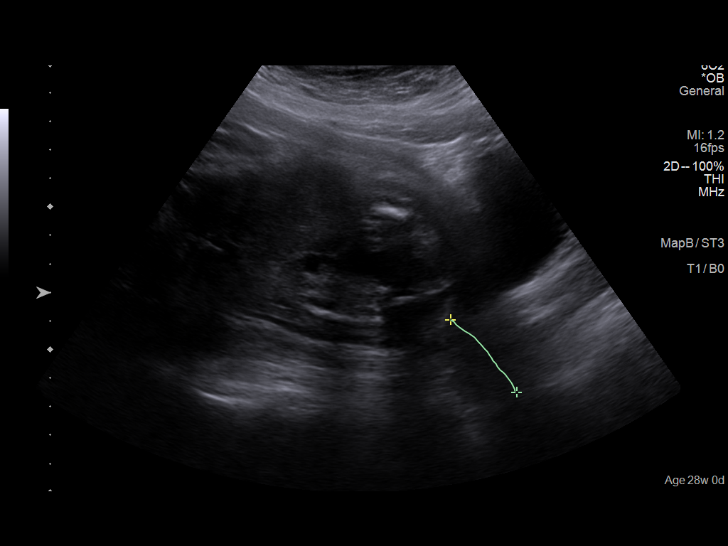
[im 4/35]
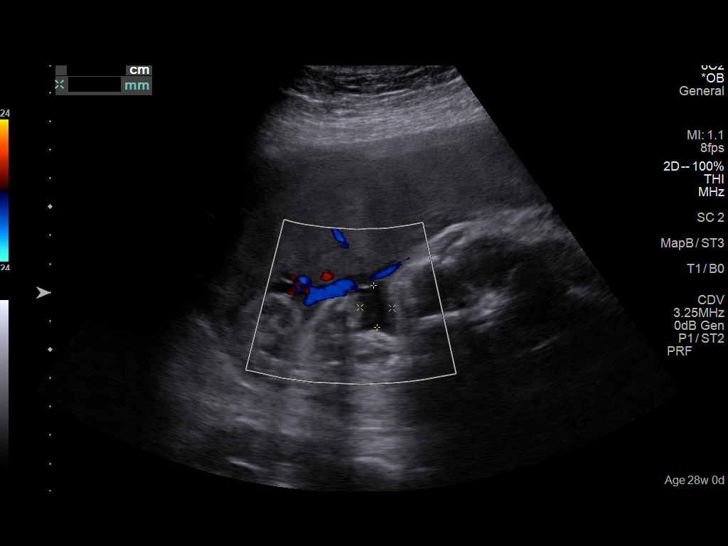
[im 7/35]
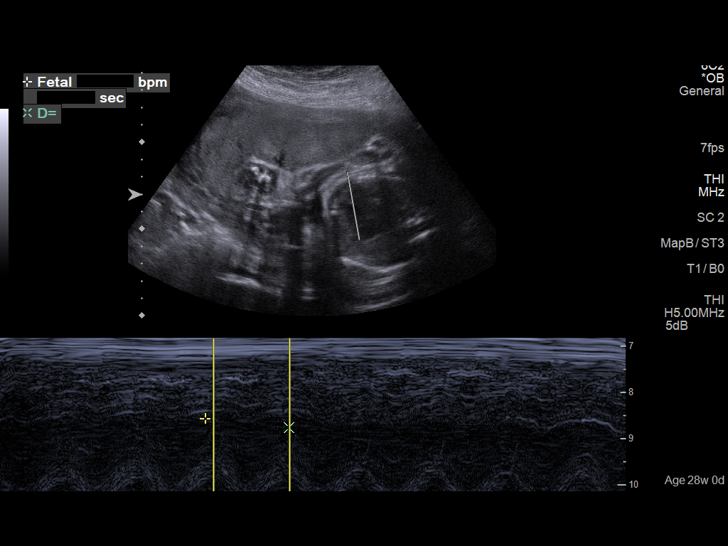
[im 11/35]
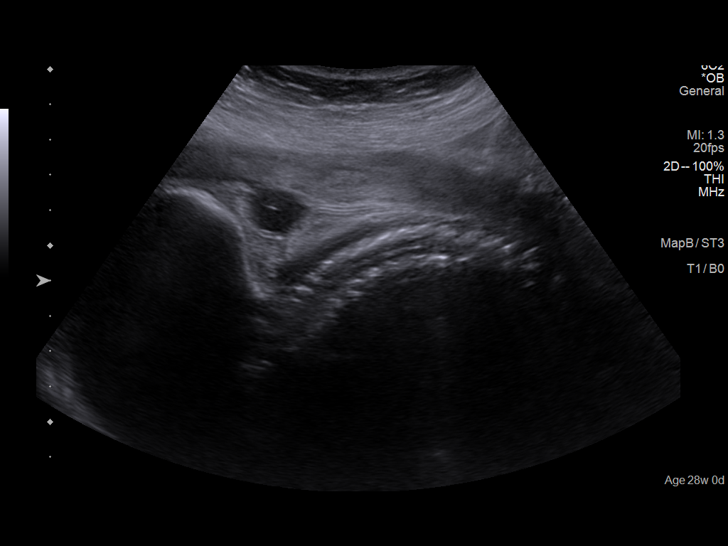
[im 13/35]
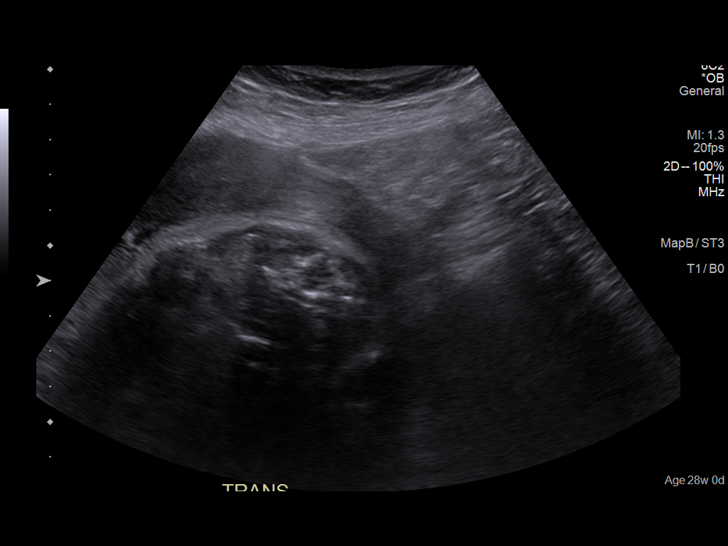
[im 16/35]
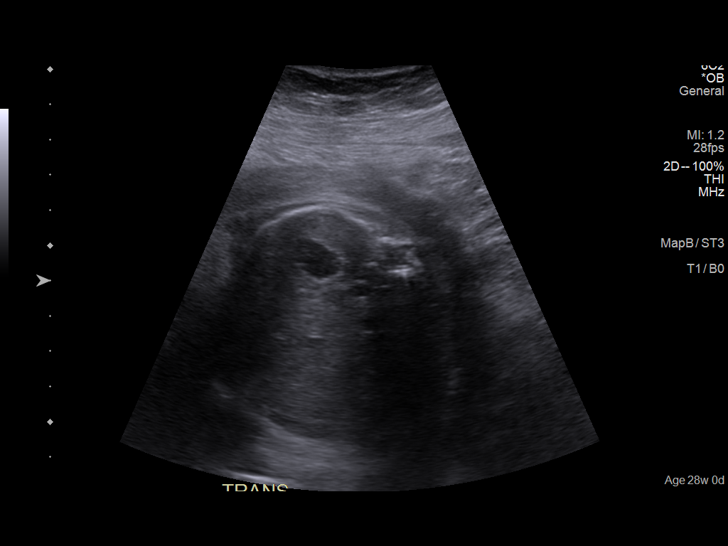
[im 19/35]
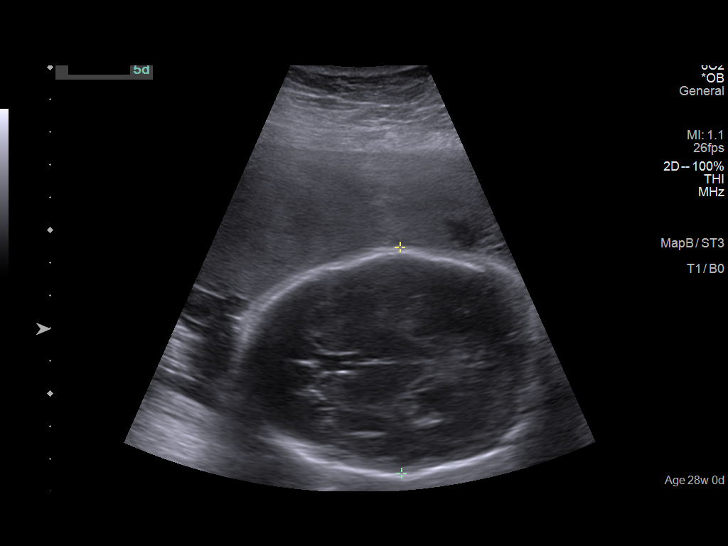
[im 22/35]
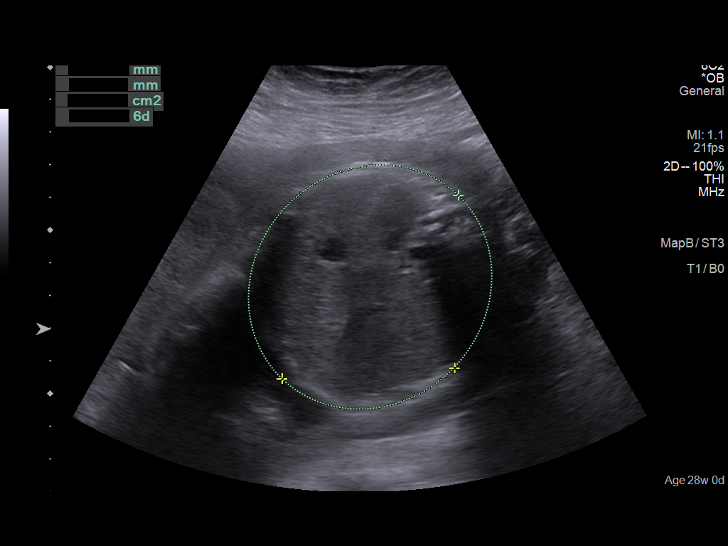
[im 24/35]
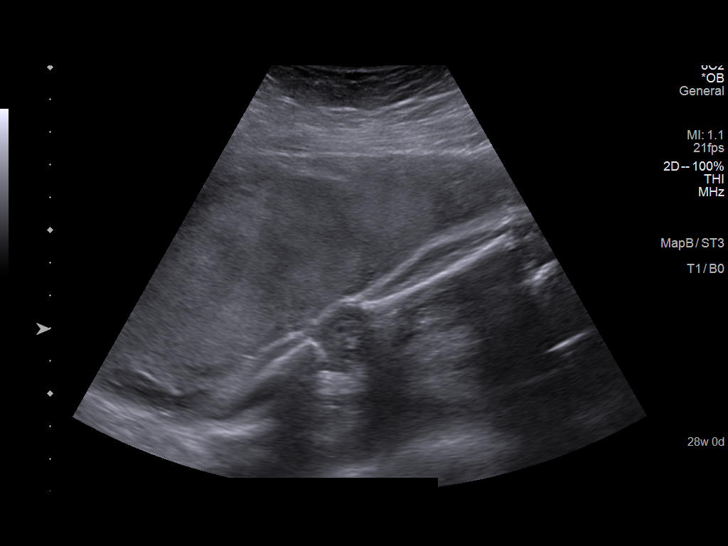
[im 28/35]
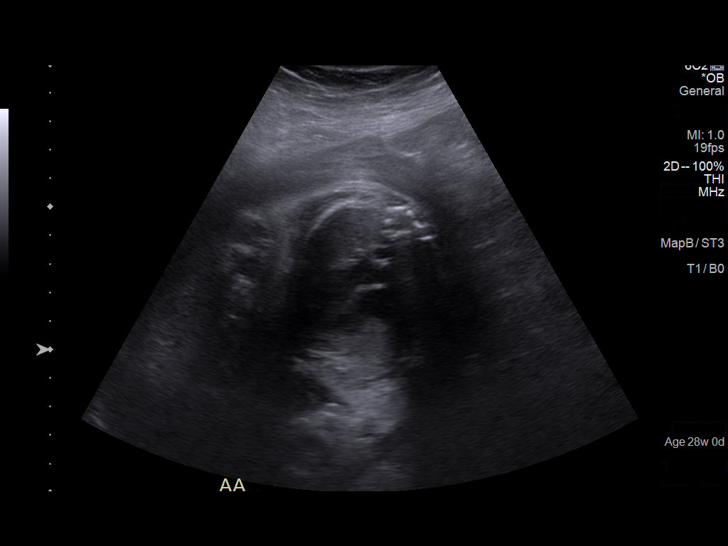
[im 31/35]
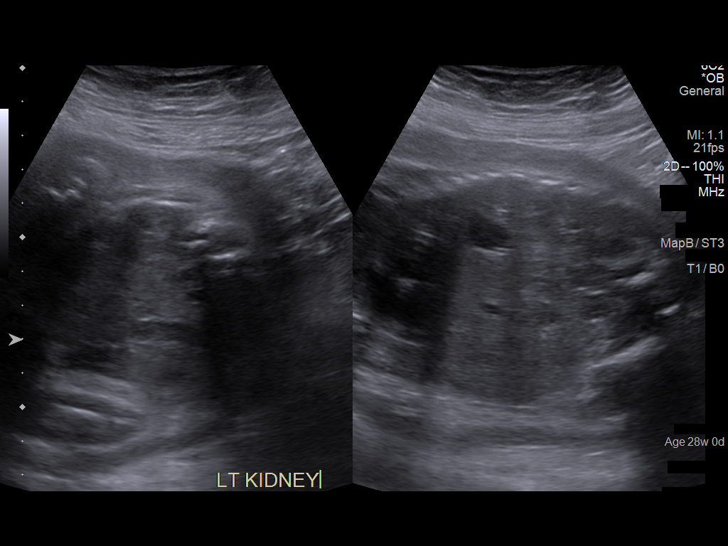
[im 33/35]
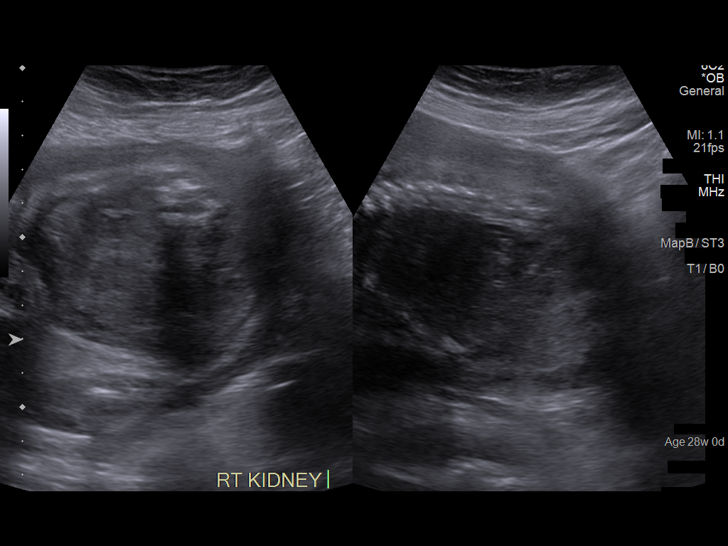

[12 of 28 positions shown; findings below may reference images not displayed]

IMPRESSION: Dear Ms. RB
 ,

 Thank you for referring your patient  for a fetal growth
 evaluation due to AMA, obesity and to follow up spine views
 that were not seen before.

 There is a singleton gestation at 28w 0d dated by earliest
 scan done at [HOSPITAL] [facility] at 13w 1d on 10/03/2018.
 There is severe oligohydramnios with a single 1.5 x 1.1 cm
 pocket
 Fetus is in the frank breech position. spine is normal
 appearing  , kidneys, stomach and bladder were imaged and
 appeared normal .
 Postive fetal movement and tone.

 The fetal biometry correlates with established dating.
 Adequate interval growth noted. 6633 g
 The estimated fetal weight is at the  30%ile
    percentile. BENGT-ARNE

 Using an ipad interpreter the patient reports a daily discharge
 for which she uses a pad. She does not report a gush but a
 daily discharge for about a week. She denies any fever or
 pain. She notes intermittent pressure on her bladder that
 cause her to urinate.
 I reviewed my concern for premature preterm rupture of
 membranes and recommended she go to L&D for a sterile
 speculum exam . If confirmed , I reviewed with her the typical
 management with Latency antibiotics, Amarta and
 expectant care until 34 weeks with careful observation for
 infection inthe hospital. I told her the fetus is breech and that
 the chance for spontaneous version  is less likely with
 oligohydramnios. I reviewed at this gestational age she will
 likely require transfer to a tertiary care center with a NICU
 that cares for 28 week neonates if delviery is indicated.

 [facility] .

## 2021-04-13 ENCOUNTER — Encounter: Payer: Self-pay | Admitting: Emergency Medicine

## 2021-04-13 DIAGNOSIS — T39395A Adverse effect of other nonsteroidal anti-inflammatory drugs [NSAID], initial encounter: Secondary | ICD-10-CM | POA: Diagnosis present

## 2021-04-13 DIAGNOSIS — E1165 Type 2 diabetes mellitus with hyperglycemia: Secondary | ICD-10-CM | POA: Diagnosis present

## 2021-04-13 DIAGNOSIS — G030 Nonpyogenic meningitis: Secondary | ICD-10-CM | POA: Diagnosis present

## 2021-04-13 DIAGNOSIS — Z8632 Personal history of gestational diabetes: Secondary | ICD-10-CM

## 2021-04-13 DIAGNOSIS — E669 Obesity, unspecified: Secondary | ICD-10-CM | POA: Diagnosis present

## 2021-04-13 DIAGNOSIS — Z7984 Long term (current) use of oral hypoglycemic drugs: Secondary | ICD-10-CM

## 2021-04-13 DIAGNOSIS — Z833 Family history of diabetes mellitus: Secondary | ICD-10-CM

## 2021-04-13 DIAGNOSIS — Z20822 Contact with and (suspected) exposure to covid-19: Secondary | ICD-10-CM | POA: Diagnosis present

## 2021-04-13 DIAGNOSIS — K760 Fatty (change of) liver, not elsewhere classified: Secondary | ICD-10-CM | POA: Diagnosis present

## 2021-04-13 DIAGNOSIS — Z8249 Family history of ischemic heart disease and other diseases of the circulatory system: Secondary | ICD-10-CM

## 2021-04-13 DIAGNOSIS — N10 Acute tubulo-interstitial nephritis: Secondary | ICD-10-CM | POA: Diagnosis present

## 2021-04-13 DIAGNOSIS — Z8615 Personal history of latent tuberculosis infection: Secondary | ICD-10-CM

## 2021-04-13 DIAGNOSIS — Z6833 Body mass index (BMI) 33.0-33.9, adult: Secondary | ICD-10-CM

## 2021-04-13 DIAGNOSIS — N179 Acute kidney failure, unspecified: Secondary | ICD-10-CM | POA: Diagnosis present

## 2021-04-13 DIAGNOSIS — A4151 Sepsis due to Escherichia coli [E. coli]: Principal | ICD-10-CM | POA: Diagnosis present

## 2021-04-13 DIAGNOSIS — B9620 Unspecified Escherichia coli [E. coli] as the cause of diseases classified elsewhere: Secondary | ICD-10-CM | POA: Diagnosis present

## 2021-04-13 DIAGNOSIS — G43909 Migraine, unspecified, not intractable, without status migrainosus: Secondary | ICD-10-CM | POA: Diagnosis present

## 2021-04-13 LAB — RESP PANEL BY RT-PCR (FLU A&B, COVID) ARPGX2
Influenza A by PCR: NEGATIVE
Influenza B by PCR: NEGATIVE
SARS Coronavirus 2 by RT PCR: NEGATIVE

## 2021-04-13 MED ORDER — ACETAMINOPHEN 325 MG PO TABS
650.0000 mg | ORAL_TABLET | Freq: Once | ORAL | Status: AC
  Administered 2021-04-13: 650 mg via ORAL
  Filled 2021-04-13: qty 2

## 2021-04-13 NOTE — ED Triage Notes (Signed)
First RN Note: Pt c/o feeling sick since Friday, c/o fever, chills, nausea, and HA, states has taken OTC meds without relief. Language line used for check in at this time.

## 2021-04-13 NOTE — ED Triage Notes (Signed)
Pt c/o fever, headache and chills x3 days. Pt last took motrin at 1830. Pt denies cough and congestion as well as SOB.

## 2021-04-14 ENCOUNTER — Inpatient Hospital Stay
Admission: EM | Admit: 2021-04-14 | Discharge: 2021-04-16 | DRG: 871 | Disposition: A | Discharge status: Home / Self Care | Payer: Medicaid Other | Attending: Internal Medicine | Admitting: Internal Medicine

## 2021-04-14 ENCOUNTER — Other Ambulatory Visit: Payer: Self-pay

## 2021-04-14 ENCOUNTER — Inpatient Hospital Stay: Payer: Medicaid Other

## 2021-04-14 ENCOUNTER — Emergency Department: Payer: Medicaid Other

## 2021-04-14 ENCOUNTER — Encounter: Payer: Self-pay | Admitting: Internal Medicine

## 2021-04-14 DIAGNOSIS — Z87898 Personal history of other specified conditions: Secondary | ICD-10-CM

## 2021-04-14 DIAGNOSIS — Z833 Family history of diabetes mellitus: Secondary | ICD-10-CM | POA: Diagnosis not present

## 2021-04-14 DIAGNOSIS — G43909 Migraine, unspecified, not intractable, without status migrainosus: Secondary | ICD-10-CM | POA: Diagnosis present

## 2021-04-14 DIAGNOSIS — T50905A Adverse effect of unspecified drugs, medicaments and biological substances, initial encounter: Secondary | ICD-10-CM

## 2021-04-14 DIAGNOSIS — Z8249 Family history of ischemic heart disease and other diseases of the circulatory system: Secondary | ICD-10-CM | POA: Diagnosis not present

## 2021-04-14 DIAGNOSIS — Z6830 Body mass index (BMI) 30.0-30.9, adult: Secondary | ICD-10-CM | POA: Diagnosis not present

## 2021-04-14 DIAGNOSIS — R519 Headache, unspecified: Secondary | ICD-10-CM | POA: Diagnosis not present

## 2021-04-14 DIAGNOSIS — N10 Acute tubulo-interstitial nephritis: Secondary | ICD-10-CM | POA: Diagnosis present

## 2021-04-14 DIAGNOSIS — A419 Sepsis, unspecified organism: Secondary | ICD-10-CM

## 2021-04-14 DIAGNOSIS — Z8632 Personal history of gestational diabetes: Secondary | ICD-10-CM | POA: Diagnosis not present

## 2021-04-14 DIAGNOSIS — K760 Fatty (change of) liver, not elsewhere classified: Secondary | ICD-10-CM | POA: Diagnosis present

## 2021-04-14 DIAGNOSIS — B9620 Unspecified Escherichia coli [E. coli] as the cause of diseases classified elsewhere: Secondary | ICD-10-CM | POA: Diagnosis present

## 2021-04-14 DIAGNOSIS — E1165 Type 2 diabetes mellitus with hyperglycemia: Secondary | ICD-10-CM | POA: Diagnosis present

## 2021-04-14 DIAGNOSIS — A4151 Sepsis due to Escherichia coli [E. coli]: Secondary | ICD-10-CM | POA: Diagnosis present

## 2021-04-14 DIAGNOSIS — T39395A Adverse effect of other nonsteroidal anti-inflammatory drugs [NSAID], initial encounter: Secondary | ICD-10-CM | POA: Diagnosis present

## 2021-04-14 DIAGNOSIS — E669 Obesity, unspecified: Secondary | ICD-10-CM | POA: Diagnosis present

## 2021-04-14 DIAGNOSIS — N179 Acute kidney failure, unspecified: Secondary | ICD-10-CM | POA: Diagnosis present

## 2021-04-14 DIAGNOSIS — Z8615 Personal history of latent tuberculosis infection: Secondary | ICD-10-CM | POA: Diagnosis not present

## 2021-04-14 DIAGNOSIS — R7612 Nonspecific reaction to cell mediated immunity measurement of gamma interferon antigen response without active tuberculosis: Secondary | ICD-10-CM | POA: Diagnosis not present

## 2021-04-14 DIAGNOSIS — G030 Nonpyogenic meningitis: Secondary | ICD-10-CM | POA: Diagnosis present

## 2021-04-14 DIAGNOSIS — N12 Tubulo-interstitial nephritis, not specified as acute or chronic: Secondary | ICD-10-CM | POA: Diagnosis not present

## 2021-04-14 DIAGNOSIS — Z7984 Long term (current) use of oral hypoglycemic drugs: Secondary | ICD-10-CM | POA: Diagnosis not present

## 2021-04-14 DIAGNOSIS — Z20822 Contact with and (suspected) exposure to covid-19: Secondary | ICD-10-CM | POA: Diagnosis present

## 2021-04-14 DIAGNOSIS — Z6833 Body mass index (BMI) 33.0-33.9, adult: Secondary | ICD-10-CM | POA: Diagnosis not present

## 2021-04-14 DIAGNOSIS — R509 Fever, unspecified: Secondary | ICD-10-CM | POA: Diagnosis present

## 2021-04-14 HISTORY — DX: Nonspecific reaction to cell mediated immunity measurement of gamma interferon antigen response without active tuberculosis: R76.12

## 2021-04-14 HISTORY — DX: Gestational diabetes mellitus in pregnancy, unspecified control: O24.419

## 2021-04-14 LAB — URINALYSIS, COMPLETE (UACMP) WITH MICROSCOPIC
Bilirubin Urine: NEGATIVE
Glucose, UA: >=500 mg/dL — AB
Ketones, ur: 5 mg/dL — AB
Leukocytes,Ua: NEGATIVE
Nitrite: NEGATIVE
Protein, ur: 100 mg/dL — AB
RBC / HPF: >50 RBC/hpf — ABNORMAL HIGH (ref 0–5)
Specific Gravity, Urine: 1.031 — ABNORMAL HIGH (ref 1.005–1.030)
pH: 5 (ref 5.0–8.0)

## 2021-04-14 LAB — HEMOGLOBIN A1C
Hgb A1c MFr Bld: 4.9 % (ref 4.8–5.6)
Mean Plasma Glucose: 93.93 mg/dL

## 2021-04-14 LAB — CSF CELL COUNT WITH DIFFERENTIAL
Eosinophils, CSF: UNDETERMINED %
Lymphs, CSF: UNDETERMINED %
Lymphs, CSF: 30 %
Monocyte-Macrophage-Spinal Fluid: UNDETERMINED %
RBC Count, CSF: 0 /mm3 (ref 0–3)
RBC Count, CSF: 0 /mm3 (ref 0–3)
Segmented Neutrophils-CSF: UNDETERMINED %
Segmented Neutrophils-CSF: 70 %
Tube #: 1
Tube #: 4
WBC, CSF: 5 /mm3 (ref 0–5)
WBC, CSF: 43 /mm3 (ref 0–5)

## 2021-04-14 LAB — COMPREHENSIVE METABOLIC PANEL
ALT: 28 U/L (ref 0–44)
AST: 22 U/L (ref 15–41)
Albumin: 3 g/dL — ABNORMAL LOW (ref 3.5–5.0)
Alkaline Phosphatase: 62 U/L (ref 38–126)
Anion gap: 12 (ref 5–15)
BUN: 13 mg/dL (ref 6–20)
CO2: 21 mmol/L — ABNORMAL LOW (ref 22–32)
Calcium: 8.1 mg/dL — ABNORMAL LOW (ref 8.9–10.3)
Chloride: 104 mmol/L (ref 98–111)
Creatinine, Ser: 1.43 mg/dL — ABNORMAL HIGH (ref 0.44–1.00)
GFR, Estimated: 48 mL/min — ABNORMAL LOW (ref >60)
Glucose, Bld: 170 mg/dL — ABNORMAL HIGH (ref 70–99)
Potassium: 3.8 mmol/L (ref 3.5–5.1)
Sodium: 137 mmol/L (ref 135–145)
Total Bilirubin: 1.1 mg/dL (ref 0.3–1.2)
Total Protein: 6.8 g/dL (ref 6.5–8.1)

## 2021-04-14 LAB — APTT: aPTT: 36 seconds (ref 24–36)

## 2021-04-14 LAB — CBC WITH DIFFERENTIAL/PLATELET
Abs Immature Granulocytes: 0.44 10*3/uL — ABNORMAL HIGH (ref 0.00–0.07)
Basophils Absolute: 0 10*3/uL (ref 0.0–0.1)
Basophils Relative: 0 %
Eosinophils Absolute: 0 10*3/uL (ref 0.0–0.5)
Eosinophils Relative: 0 %
HCT: 33.3 % — ABNORMAL LOW (ref 36.0–46.0)
Hemoglobin: 10.8 g/dL — ABNORMAL LOW (ref 12.0–15.0)
Immature Granulocytes: 2 %
Lymphocytes Relative: 4 %
Lymphs Abs: 0.9 10*3/uL (ref 0.7–4.0)
MCH: 26.9 pg (ref 26.0–34.0)
MCHC: 32.4 g/dL (ref 30.0–36.0)
MCV: 82.8 fL (ref 80.0–100.0)
Monocytes Absolute: 1.6 10*3/uL — ABNORMAL HIGH (ref 0.1–1.0)
Monocytes Relative: 7 %
Neutro Abs: 19.9 10*3/uL — ABNORMAL HIGH (ref 1.7–7.7)
Neutrophils Relative %: 87 %
Platelets: 284 10*3/uL (ref 150–400)
RBC: 4.02 MIL/uL (ref 3.87–5.11)
RDW: 15.5 % (ref 11.5–15.5)
WBC: 22.9 10*3/uL — ABNORMAL HIGH (ref 4.0–10.5)
nRBC: 0 % (ref 0.0–0.2)

## 2021-04-14 LAB — PROTIME-INR
INR: 1.4 — ABNORMAL HIGH (ref 0.8–1.2)
Prothrombin Time: 16.8 seconds — ABNORMAL HIGH (ref 11.4–15.2)

## 2021-04-14 LAB — GLUCOSE, CSF: Glucose, CSF: 90 mg/dL — ABNORMAL HIGH (ref 40–70)

## 2021-04-14 LAB — CBG MONITORING, ED
Glucose-Capillary: 165 mg/dL — ABNORMAL HIGH (ref 70–99)
Glucose-Capillary: 120 mg/dL — ABNORMAL HIGH (ref 70–99)
Glucose-Capillary: 117 mg/dL — ABNORMAL HIGH (ref 70–99)

## 2021-04-14 LAB — HIV ANTIBODY (ROUTINE TESTING W REFLEX): HIV Screen 4th Generation wRfx: NONREACTIVE

## 2021-04-14 LAB — PROTEIN, CSF: Total  Protein, CSF: 24 mg/dL (ref 15–45)

## 2021-04-14 LAB — PROCALCITONIN: Procalcitonin: 2.38 ng/mL

## 2021-04-14 LAB — LACTIC ACID, PLASMA
Lactic Acid, Venous: 1.9 mmol/L (ref 0.5–1.9)
Lactic Acid, Venous: 1.3 mmol/L (ref 0.5–1.9)

## 2021-04-14 LAB — POC URINE PREG, ED: Preg Test, Ur: NEGATIVE

## 2021-04-14 LAB — GLUCOSE, CAPILLARY
Glucose-Capillary: 85 mg/dL (ref 70–99)
Glucose-Capillary: 130 mg/dL — ABNORMAL HIGH (ref 70–99)

## 2021-04-14 MED ORDER — KETOROLAC TROMETHAMINE 30 MG/ML IJ SOLN
15.0000 mg | Freq: Once | INTRAMUSCULAR | Status: AC
  Administered 2021-04-14: 15 mg via INTRAVENOUS
  Filled 2021-04-14: qty 1

## 2021-04-14 MED ORDER — METOCLOPRAMIDE HCL 5 MG/ML IJ SOLN
10.0000 mg | Freq: Once | INTRAMUSCULAR | Status: AC
  Administered 2021-04-14: 10 mg via INTRAVENOUS
  Filled 2021-04-14: qty 2

## 2021-04-14 MED ORDER — LACTATED RINGERS IV BOLUS
1000.0000 mL | Freq: Once | INTRAVENOUS | Status: AC
  Administered 2021-04-14: 1000 mL via INTRAVENOUS

## 2021-04-14 MED ORDER — SODIUM CHLORIDE 0.9 % IV SOLN
2.0000 g | Freq: Two times a day (BID) | INTRAVENOUS | Status: DC
  Administered 2021-04-14 – 2021-04-15 (×2): 2 g via INTRAVENOUS
  Filled 2021-04-14: qty 2
  Filled 2021-04-14: qty 20
  Filled 2021-04-14: qty 2

## 2021-04-14 MED ORDER — SODIUM CHLORIDE 0.9 % IV BOLUS
500.0000 mL | Freq: Once | INTRAVENOUS | Status: AC
  Administered 2021-04-14: 500 mL via INTRAVENOUS

## 2021-04-14 MED ORDER — HYDROMORPHONE HCL 1 MG/ML IJ SOLN
1.0000 mg | Freq: Once | INTRAMUSCULAR | Status: DC
  Filled 2021-04-14: qty 1

## 2021-04-14 MED ORDER — INSULIN ASPART 100 UNIT/ML IJ SOLN
0.0000 [IU] | INTRAMUSCULAR | Status: DC
  Administered 2021-04-14: 1 [IU] via SUBCUTANEOUS
  Administered 2021-04-15 (×3): 3 [IU] via SUBCUTANEOUS
  Administered 2021-04-16: 2 [IU] via SUBCUTANEOUS
  Administered 2021-04-16: 1 [IU] via SUBCUTANEOUS
  Administered 2021-04-16: 2 [IU] via SUBCUTANEOUS
  Filled 2021-04-14 (×7): qty 1

## 2021-04-14 MED ORDER — SODIUM CHLORIDE 0.9 % IV SOLN
2.0000 g | Freq: Once | INTRAVENOUS | Status: AC
  Administered 2021-04-14: 2 g via INTRAVENOUS
  Filled 2021-04-14: qty 20

## 2021-04-14 MED ORDER — LACTATED RINGERS IV BOLUS: 1000.0000 mL | Freq: Once | INTRAVENOUS | Status: DC

## 2021-04-14 MED ORDER — LACTATED RINGERS IV SOLN: INTRAVENOUS | Status: DC

## 2021-04-14 MED ORDER — SODIUM CHLORIDE 0.9 % IV SOLN: INTRAVENOUS | Status: DC

## 2021-04-14 MED ORDER — SODIUM CHLORIDE 0.9 % IV BOLUS
1000.0000 mL | Freq: Once | INTRAVENOUS | Status: AC
  Administered 2021-04-14: 1000 mL via INTRAVENOUS

## 2021-04-14 MED ORDER — ACETAMINOPHEN 325 MG PO TABS
650.0000 mg | ORAL_TABLET | Freq: Four times a day (QID) | ORAL | Status: DC | PRN
  Administered 2021-04-14 – 2021-04-15 (×3): 650 mg via ORAL
  Filled 2021-04-14 (×3): qty 2

## 2021-04-14 MED ORDER — PROCHLORPERAZINE EDISYLATE 10 MG/2ML IJ SOLN
10.0000 mg | Freq: Once | INTRAMUSCULAR | Status: AC
  Administered 2021-04-14: 10 mg via INTRAVENOUS
  Filled 2021-04-14: qty 2

## 2021-04-14 MED ORDER — SUMATRIPTAN SUCCINATE 6 MG/0.5ML ~~LOC~~ SOLN
6.0000 mg | Freq: Once | SUBCUTANEOUS | Status: AC
  Administered 2021-04-14: 6 mg via SUBCUTANEOUS
  Filled 2021-04-14: qty 0.5

## 2021-04-14 MED ORDER — DIPHENHYDRAMINE HCL 50 MG/ML IJ SOLN
50.0000 mg | Freq: Once | INTRAMUSCULAR | Status: AC
  Administered 2021-04-14: 50 mg via INTRAVENOUS
  Filled 2021-04-14: qty 1

## 2021-04-14 MED ORDER — ONDANSETRON HCL 4 MG/2ML IJ SOLN: 4.0000 mg | Freq: Three times a day (TID) | INTRAMUSCULAR | Status: DC | PRN

## 2021-04-14 NOTE — Progress Notes (Signed)
Pt states she's able to walk to and from the bathroom without assistance.

## 2021-04-14 NOTE — ED Notes (Signed)
Pt assisted to [facility] due to room toilet not in service, pt given menstrual pads as well due to being on menses.

## 2021-04-14 NOTE — Consult Note (Addendum)
NAME: Summer Johnson  DOB: 03/15/1981  MRN: [id number]  Date/Time: 04/14/2021 8:54 AM  REQUESTING PROVIDER: Dr.Niu Subjective:  REASON FOR CONSULT: Qunatiferon gold positive ? Summer Johnson is a 40 y.o. female with a history of prediabetes presents to the ED on 04/13/2021 with a history of fever fever chills headache.  The symptoms started 3 days ago.  The headache is throbbing in nature and is in the occipital region and over the forehead- she has been taking 200mg  Ibuprofen X 8/day for the past 3 days.  She did not have any cough, runny nose, sore throat. 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 Vitals in the ED temperature 99.7, BP 108 74, pulse rate 121, respiratory rate 18 and sats 96%.  WBC 22.9, Hb 10.8, platelet 284 and creatinine 1.43.  UA showed many WBCs.  CT scan of the abdomen showed No definite calcification.  Extensive perinephric stranding around the right kidney and some right-sided periureteric soft tissue stranding.  No hydronephrosis.  This was suggestive of pyelonephritis. CT head was normal She underwent lumbar puncture for the headache and that showed tube 1 of WBC of 5 and 2 for WBC of 43.  With 70% neutrophils.  Total protein was 24.  CSF sugar was 90.  Blood culture and urine culture sent and she was started on ceftriaxone 2 g every 12. She also had a positive quantiferon gold when she was pregnant with her 4th child in 2020. She chose to defer treatment then I am asked to see the patient for the above No h/o exposure to anyone with TB Sexually active with her husband No vagnial sores or discharge Has her periods- no tampons She gets headaches often once in 2 weeks- and takes ibuporfen and is usually relived - but this time worse than before Past Medical History:  Diagnosis Date   Gestational diabetes mellitus    Positive QuantiFERON-TB Gold  test     Past Surgical History:  Procedure Laterality Date   CESAREAN SECTION      Social History   Socioeconomic History   Marital status: Married    Spouse name: 2021   Number of children: 3   Years of education: Not on file   Highest education level: 8th grade  Occupational History   Occupation: [employer]   Tobacco Use   Smoking status: Never   Smokeless tobacco: Never  Vaping Use   Vaping Use: Never used  Substance and Sexual Activity   Alcohol use: Never   Drug use: Never   Sexual activity: Yes  Other Topics Concern   Not on file  Social History Narrative   Not on file   Social Determinants of Health   Financial Resource Strain: Not on file  Food Insecurity: Not on file  Transportation Needs: Not on file  Physical Activity: Not on file  Stress: Not on file  Social Connections: Not on file  Intimate Partner Violence: Not on file    Family History  Problem Relation Age of Onset   Hypertension Mother    Diabetes Mother    Diabetes Sister    No Known Allergies I? Current Facility-Administered Medications  Medication Dose Route Frequency Provider Last Rate Last Admin   0.9 %  sodium chloride infusion   Intravenous Continuous Programme researcher, broadcasting/film/video, MD       acetaminophen (TYLENOL) tablet  650 mg  650 mg Oral Q6H PRN Ivor Costa, MD   650 mg at 04/14/21 0830   cefTRIAXone (ROCEPHIN) 2 g in sodium chloride 0.9 % 100 mL IVPB  2 g Intravenous Q12H Ivor Costa, MD       insulin aspart (novoLOG) injection 0-9 Units  0-9 Units Subcutaneous Q4H Ivor Costa, MD       ondansetron [facility]) injection 4 mg  4 mg Intravenous Q8H PRN Ivor Costa, MD       sodium chloride 0.9 % bolus 500 mL  500 mL Intravenous Once Ivor Costa, MD       Current Outpatient Medications  Medication Sig Dispense Refill   clotrimazole (CLOTRIMAZOLE AF) 1 % cream Apply 1 application topically 2 (two) times daily. 30 g 0     Abtx:  Anti-infectives (From admission, onward)    Start      Dose/Rate Route Frequency Ordered Stop   04/14/21 1600  cefTRIAXone (ROCEPHIN) 2 g in sodium chloride 0.9 % 100 mL IVPB        2 g 200 mL/hr over 30 Minutes Intravenous Every 12 hours 04/14/21 0723     04/14/21 0345  cefTRIAXone (ROCEPHIN) 2 g in sodium chloride 0.9 % 100 mL IVPB        2 g 200 mL/hr over 30 Minutes Intravenous  Once 04/14/21 [id number] 04/14/21 0515       REVIEW OF SYSTEMS:  Const:  fever,  chills, negative weight loss Eyes: negative diplopia or visual changes, negative eye pain ENT: negative coryza, negative sore throat Resp: negative cough, hemoptysis, dyspnea Cards: negative for chest pain, palpitations, lower extremity edema GU: ++ frequency, some dysuria no hematuria GI: Negative for abdominal pain, diarrhea, bleeding, constipation Skin: negative for rash and pruritus Heme: negative for easy bruising and gum/nose bleeding MS:  myalgias, arthralgias, muscle weakness Neurolo:++ headaches, no dizziness, vertigo, memory problems  Psych: negative for feelings of anxiety, depression  Endocrine:  diabetes Allergy/Immunology- negative for any medication or food allergies ?  Objective:  VITALS:  BP 103/61 (BP Location: Left Arm)   Pulse (!) 123   Temp 98.1 F (36.7 C) (Oral)   Resp 18   SpO2 98%  PHYSICAL EXAM:  General: Alert, cooperative, no distress, appears stated age.  Head: Normocephalic, without obvious abnormality, atraumatic. Eyes: Conjunctivae clear, anicteric sclerae. Pupils are equal ENT Nares normal. No drainage or sinus tenderness. Lips, mucosa, and tongue normal. No Thrush Neck: Supple, symmetrical, no adenopathy, thyroid: non tender no carotid bruit and no JVD. Back: No CVA tenderness. Lungs: Clear to auscultation bilaterally. No Wheezing or Rhonchi. No rales. Heart: Regular rate and rhythm, no murmur, rub or gallop. Abdomen: Soft, non-tender,not distended. Bowel sounds normal. No masses Extremities: atraumatic, no cyanosis. No edema. No  clubbing Skin: No rashes or lesions. Or bruising Lymph: Cervical, supraclavicular normal. Neurologic: Grossly non-focal Pertinent Labs Lab Results CBC    Component Value Date/Time   WBC 22.9 (H) 04/14/2021 0129   RBC 4.02 04/14/2021 0129   HGB 10.8 (L) 04/14/2021 0129   HGB 12.1 09/19/2018 0000   HCT 33.3 (L) 04/14/2021 0129   PLT 284 04/14/2021 0129   PLT 293 09/19/2018 0000   MCV 82.8 04/14/2021 0129   MCH 26.9 04/14/2021 0129   MCHC 32.4 04/14/2021 0129   RDW 15.5 04/14/2021 0129   LYMPHSABS 0.9 04/14/2021 0129   MONOABS 1.6 (H) 04/14/2021 0129   EOSABS 0.0 04/14/2021 0129   BASOSABS 0.0 04/14/2021 0129    CMP Latest  Ref Rng & Units 04/14/2021  Glucose 70 - 99 mg/dL 170(H)  BUN 6 - 20 mg/dL 13  Creatinine 0.44 - 1.00 mg/dL 1.43(H)  Sodium 135 - 145 mmol/L 137  Potassium 3.5 - 5.1 mmol/L 3.8  Chloride 98 - 111 mmol/L 104  CO2 22 - 32 mmol/L 21(L)  Calcium 8.9 - 10.3 mg/dL 8.1(L)  Total Protein 6.5 - 8.1 g/dL 6.8  Total Bilirubin 0.3 - 1.2 mg/dL 1.1  Alkaline Phos 38 - 126 U/L 62  AST 15 - 41 U/L 22  ALT 0 - 44 U/L 28      Microbiology: Recent Results (from the past 240 hour(s))  Resp Panel by RT-PCR (Flu A&B, Covid) Nasopharyngeal Swab     Status: None   Collection Time: 04/13/21  8:50 PM   Specimen: Nasopharyngeal Swab; Nasopharyngeal(NP) swabs in vial transport medium  Result Value Ref Range Status   SARS Coronavirus 2 by RT PCR NEGATIVE NEGATIVE Final    Comment: (NOTE) SARS-CoV-2 target nucleic acids are NOT DETECTED.  The SARS-CoV-2 RNA is generally detectable in upper respiratory specimens during the acute phase of infection. The lowest concentration of SARS-CoV-2 viral copies this assay can detect is 138 copies/mL. A negative result does not preclude SARS-Cov-2 infection and should not be used as the sole basis for treatment or other patient management decisions. A negative result may occur with  improper specimen collection/handling, submission of  specimen other than nasopharyngeal swab, presence of viral mutation(s) within the areas targeted by this assay, and inadequate number of viral copies(<138 copies/mL). A negative result must be combined with clinical observations, patient history, and epidemiological information. The expected result is Negative.  Fact Sheet for Patients:  EntrepreneurPulse.com.au  Fact Sheet for Healthcare Providers:  IncredibleEmployment.be  This test is no t yet approved or cleared by the [place] FDA and  has been authorized for detection and/or diagnosis of SARS-CoV-2 by FDA under an Emergency Use Authorization (EUA). This EUA will remain  in effect (meaning this test can be used) for the duration of the COVID-19 declaration under Section 564(b)(1) of the Act, 21 U.S.C.section 360bbb-3(b)(1), unless the authorization is terminated  or revoked sooner.       Influenza A by PCR NEGATIVE NEGATIVE Final   Influenza B by PCR NEGATIVE NEGATIVE Final    Comment: (NOTE) The Xpert Xpress SARS-CoV-2/FLU/RSV plus assay is intended as an aid in the diagnosis of influenza from Nasopharyngeal swab specimens and should not be used as a sole basis for treatment. Nasal washings and aspirates are unacceptable for Xpert Xpress SARS-CoV-2/FLU/RSV testing.  Fact Sheet for Patients: EntrepreneurPulse.com.au  Fact Sheet for Healthcare Providers: IncredibleEmployment.be  This test is not yet approved or cleared by the [place] FDA and has been authorized for detection and/or diagnosis of SARS-CoV-2 by FDA under an Emergency Use Authorization (EUA). This EUA will remain in effect (meaning this test can be used) for the duration of the COVID-19 declaration under Section 564(b)(1) of the Act, 21 U.S.C. section 360bbb-3(b)(1), unless the authorization is terminated or revoked.  Performed at [facility], [address] [place], [place] [address]   CSF culture w Stat Gram Stain     Status: None (Preliminary result)   Collection Time: 04/14/21  3:35 AM   Specimen: CSF; Cerebrospinal Fluid  Result Value Ref Range Status   Specimen Description CSF  Final   Special Requests NONE  Final   Gram Stain   Final    NO ORGANISMS SEEN  WBCS SEEN NO RBC SEEN CRITICAL RESULT CALLED TO, READ BACK BY AND VERIFIED WITH: [place] JAMES RN AT [id number] 04/14/21 GAA    Culture   Final    NO ORGANISMS SEEN Performed at [facility], [address] [place], [place] [address]    Report Status PENDING  Incomplete    IMAGING RESULTS: CT abdomen and pelvis Extensive right-sided perinephric and periureteric soft tissue stranding indicating inflammation  I have personally reviewed the films ?CXR no infiltrate  Impression/Recommendation 40 yr female presenting with headache, fever and chills, increased frequency of micturition, dysuria, DM? ?was taking 1600mg  of Ibuprofen/day for the past 3 days  UTI/pyelonephritis rt- will add urine culture to the analysis Pt currently on ceftriaxone- will reduce the frequency to once a day  AKI  Leucocytosis  Headache- likely has underlying migraine and now made worse with infection and  likely rebounding from NSAID Aseptic meningitis LP shows mild neutrophilic pleocytosis ( [id number]) and normal protein- this is not bacterial meningitis Could be drug induced meningitis from Ibuprofen- avoid NSAID  DM- on metformin ? H/o positive quantiferon gold- dont see any lab results No evidence of active TB HIV NR Will decide as OP whether she needs treatment ___________________________________________________ Discussed with patient, requesting provider Note:  This document was prepared using Dragon voice recognition software and may include unintentional dictation errors.

## 2021-04-14 NOTE — ED Notes (Signed)
Labs sent at this time, 2 SST, blue, lavender and green top

## 2021-04-14 NOTE — ED Notes (Signed)
Admitting provider at bedside at this time using VRI for Spanish interpreter

## 2021-04-14 NOTE — ED Provider Notes (Signed)
[facility]  ____________________________________________   Event Date/Time   First MD Initiated Contact with Patient 04/14/21 0040     (approximate)  I have reviewed the triage vital signs and the nursing notes.   HISTORY  Chief Complaint Headache and Fever    HPI Summer Johnson is a 40 y.o. female with past medical history of prediabetes who presents with headache and fever.  Symptoms started about 3 days ago.  Patient endorses a diffuse throbbing headache both in the anterior and posterior parts of her head.  Has been relatively constant.  There is no associated neck pain diplopia numbness weakness.  Has had some nausea and one episode of emesis.  Denies abdominal pain diarrhea.  Patient is also had subjective fevers at home but did not take her temperature.  Does have some mild myalgias as well.  No sick contacts.  She denies cough or congestion but does have some subjective dyspnea with no chest pain.  Also COMPLAINED of being thirsty and frequent urination.         History reviewed. No pertinent past medical history.  Patient Active Problem List   Diagnosis Date Noted   Acute pyelonephritis 04/14/2021   Status post C-section 02/13/2019   Gestational diabetes mellitus (GDM), antepartum 02/13/2019   Depression screen 01/05/2019   Morbid obesity (HCC) 12/05/2018   Positive QuantiFERON-TB Gold test 12/05/2018    History reviewed. No pertinent surgical history.  Prior to Admission medications   Medication Sig Start Date End Date Taking? Authorizing Provider  clotrimazole (CLOTRIMAZOLE AF) 1 % cream Apply 1 application topically 2 (two) times daily. 08/24/19   Landry DykeStreilein, Annamarie, PA-C    Allergies Patient has no known allergies.  Family History  Problem Relation Age of Onset   Hypertension Mother    Diabetes Mother    Diabetes Sister     Social History Social History   Tobacco Use   Smoking status: Never   Smokeless tobacco: Never   Vaping Use   Vaping Use: Never used  Substance Use Topics   Alcohol use: Never   Drug use: Never    Review of Systems   Review of Systems  Constitutional:  Positive for chills and fever.  Eyes:  Negative for photophobia and visual disturbance.  Respiratory:  Positive for shortness of breath.   Gastrointestinal:  Positive for nausea. Negative for abdominal pain, diarrhea and vomiting.  Genitourinary:  Positive for frequency. Negative for dysuria.  Neurological:  Positive for headaches. Negative for weakness and numbness.  All other systems reviewed and are negative.  Physical Exam Updated Vital Signs BP 102/60   Pulse (!) 102   Temp 98.5 F (36.9 C) (Oral)   Resp 20   SpO2 100%   Physical Exam Vitals and nursing note reviewed.  Constitutional:      General: She is not in acute distress.    Appearance: Normal appearance.  HENT:     Head: Normocephalic and atraumatic.  Eyes:     General: No scleral icterus.    Conjunctiva/sclera: Conjunctivae normal.  Neck:     Comments: No meningismus Cardiovascular:     Rate and Rhythm: Normal rate and regular rhythm.  Pulmonary:     Effort: Pulmonary effort is normal. No respiratory distress.     Breath sounds: No stridor.  Abdominal:     General: There is no distension.     Palpations: Abdomen is soft.     Tenderness: There is no abdominal tenderness.  Musculoskeletal:  General: No deformity or signs of injury.     Cervical back: Normal range of motion and neck supple.  Skin:    General: Skin is dry.     Coloration: Skin is not jaundiced or pale.  Neurological:     General: No focal deficit present.     Mental Status: She is alert and oriented to person, place, and time. Mental status is at baseline.     Comments: Aox3, nml speech  PERRL, EOMI, face symmetric, nml tongue movement  5/5 strength in the BL upper and lower extremities  Sensation grossly intact in the BL upper and lower extremities  Finger-nose-finger  intact BL   Psychiatric:        Mood and Affect: Mood normal.        Behavior: Behavior normal.     LABS (all labs ordered are listed, but only abnormal results are displayed)  Labs Reviewed  CBC WITH DIFFERENTIAL/PLATELET - Abnormal; Notable for the following components:      Result Value   WBC 22.9 (*)    Hemoglobin 10.8 (*)    HCT 33.3 (*)    Neutro Abs 19.9 (*)    Monocytes Absolute 1.6 (*)    Abs Immature Granulocytes 0.44 (*)    All other components within normal limits  COMPREHENSIVE METABOLIC PANEL - Abnormal; Notable for the following components:   CO2 21 (*)    Glucose, Bld 170 (*)    Creatinine, Ser 1.43 (*)    Calcium 8.1 (*)    Albumin 3.0 (*)    GFR, Estimated 48 (*)    All other components within normal limits  URINALYSIS, COMPLETE (UACMP) WITH MICROSCOPIC - Abnormal; Notable for the following components:   Color, Urine YELLOW (*)    APPearance CLOUDY (*)    Specific Gravity, Urine 1.031 (*)    Glucose, UA >=500 (*)    Hgb urine dipstick LARGE (*)    Ketones, ur 5 (*)    Protein, ur 100 (*)    RBC / HPF >50 (*)    Bacteria, UA MANY (*)    All other components within normal limits  CSF CELL COUNT WITH DIFFERENTIAL - Abnormal; Notable for the following components:   WBC, CSF 43 (*)    All other components within normal limits  GLUCOSE, CSF - Abnormal; Notable for the following components:   Glucose, CSF 90 (*)    All other components within normal limits  CBG MONITORING, ED - Abnormal; Notable for the following components:   Glucose-Capillary 165 (*)    All other components within normal limits  RESP PANEL BY RT-PCR (FLU A&B, COVID) [id number]  CSF CULTURE W GRAM STAIN  CULTURE, BLOOD (ROUTINE X 2)  CULTURE, BLOOD (ROUTINE X 2)  URINE CULTURE  LACTIC ACID, PLASMA  LACTIC ACID, PLASMA  CSF CELL COUNT WITH DIFFERENTIAL  PROTEIN, CSF  HSV 1/2 PCR, CSF  POC URINE PREG, ED    ____________________________________________  EKG  N/a ____________________________________________  RADIOLOGY Ky Barban, personally viewed and evaluated these images (plain radiographs) as part of my medical decision making, as well as reviewing the written report by the radiologist.  ED MD interpretation:  I reviewed the CXR which does not show any acute cardiopulmonary process      ____________________________________________   PROCEDURES  Procedure(s) performed (including Critical Care):  .Lumbar Puncture  Date/Time: 04/14/2021 3:51 AM Performed by: Georga Hacking, MD Authorized by: Georga Hacking, MD   Consent:  Consent obtained:  Verbal and written   Risks discussed:  Infection, headache, nerve damage and pain   Alternatives discussed:  No treatment Universal protocol:    Patient identity confirmed:  Verbally with patient Pre-procedure details:    Procedure purpose:  Diagnostic   Preparation: Patient was prepped and draped in usual sterile fashion   Anesthesia:    Anesthesia method:  Local infiltration   Local anesthetic:  Lidocaine 1% WITH epi Procedure details:    Lumbar space:  L4-L5 interspace   Patient position:  Sitting   Needle gauge:  22   Needle type:  Spinal needle - Quincke tip   Needle length (in):  2.5   Ultrasound guidance: no     Number of attempts:  2   Fluid appearance:  Clear   Tubes of fluid:  4   Total volume (ml):  4 Post-procedure details:    Puncture site:  Adhesive bandage applied   Procedure completion:  Tolerated .Critical Care Performed by: Rada Hay, MD Authorized by: Rada Hay, MD   Critical care provider statement:    Critical care time (minutes):  45   Critical care was time spent personally by me on the following activities:  Development of treatment plan with patient or surrogate, discussions with consultants, evaluation of patient's response to treatment, examination of  patient, ordering and review of laboratory studies, ordering and review of radiographic studies, ordering and performing treatments and interventions, pulse oximetry, re-evaluation of patient's condition and review of old charts   Care discussed with: admitting provider     ____________________________________________   INITIAL IMPRESSION / [place] / ED COURSE     Patient is a 40 year old female presenting with fever and headache x3 days.  Her initial vital signs are notable for some soft BPs 89/60 heart rate in the low 100s but otherwise within normal limits.  She is afebrile.  She is very well-appearing on exam.  Her neurologic exam is reassuring she has no meningismus lungs are clear and abdomen is benign.  Patient does note subjective fevers headache and body aches for 3 days.  Sounds most like a viral syndrome.  Her COVID and influenza testing are negative.  Given her persistent hypotension will place a line give fluids and check basic labs including a UA.  With her polyuria and polydipsia we will also check an Accu-Chek to ensure she is not significantly hyperglycemic.  We will get a chest x-ray to rule out pneumonia.  I am considering bacterial meningitis.  Overall my suspicion for this is lower given how well she appears.  Will reassess after fluids Toradol, Reglan and see what her labs show.  Patient has a leukocytosis of 22, 19% neutrophils, lactate is normal after fluids.  Patient's UA has RBCs and many bacteria with 21-50 white blood cells.  Patient is currently on her menstrual period which would explain hematuria.  She does complain of urinary frequency but denies urgency or dysuria.  She has no back pain.  Overall it is possible that the UTI is driving her symptoms however with the primary complaint being headache I do not feel that we have adequately ruled out meningitis.  I will PE was performed with return of clear fluid.  She was given a dose of ceftriaxone after the LP to  cover for the UTI.  Will add vancomycin if CSF is looking concerning.  Patient still hypotensive after fluids, will require admission.  Patient's LP shows a normal CSF protein,  mildly elevated glucose likely in setting of her hyperglycemia.  Tube 1 has 5 WBCs, RBCs.  Oddly to before has 43 WBCs, somewhat unclear why this would be elevated compared to 1.  CT renal study was obtained which is consistent with a right-sided pyelonephritis.  Together with her UA I suspect that this is driving her sepsis.  After 3 L of fluids patient's blood pressure is stabilized, maps over 75.  Discussed with the hospitalist who will admit the patient. ____________________________________________   FINAL CLINICAL IMPRESSION(S) / ED DIAGNOSES  Final diagnoses:  Pyelonephritis     ED Discharge Orders     None        Note:  This document was prepared using Dragon voice recognition software and may include unintentional dictation errors.    Georga Hacking, MD 04/14/21 [phone]

## 2021-04-14 NOTE — H&P (Addendum)
History and Physical    Albie Colbeck [id number] DOB: 08/29/1980 DOA: 04/14/2021  Referring MD/NP/PA:   PCP: Caren Macadam, MD   Patient coming from:  The patient is coming from home.  At baseline, pt is independent for most of ADL.        Chief Complaint: fever, increased urinary frequency, headache  HPI: Summer Johnson is a 40 y.o. 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  Pt speaks Spanish, history is obtained with help of iPad interpreter. Patient states that she has fever, chills, headache for more than 4 days.  Her headache is diffuse, mainly on the top of her head, throbbing severe headache, nonradiating.  No neck pain or neck stiffness.  Patient also reports increased urinary frequency, no dysuria, burning on urination or hematuria.  Denies flank pain or back pain.  She has nausea, no vomiting, diarrhea or abdominal pain.  Denies chest pain, cough or shortness breath.  Patient was initially hypotensive with blood pressure 80/53, which improved to 103/61 after giving total of 3 L of IV fluid bolus in ED.   ED Course: pt was found to have WBC 22.9, lactic acid 1.3, 1.9, 1.3, negative pregnancy test, positive urinalysis (cloudy appearance, negative leukocyte, many bacteria, WBC 21-50, RBC> 50), AKI with creatinine 1.43 and BUN 13 (creatinine 0.5 on 01/17/2019), temperature 99.7, chest x-ray negative.  LP was performed by EDP.  Initial urinalysis showed glucose 90, protein 24, negative Gram staining, first tube with WBC 5, 4th tube with WBC 43.  CT per renal stone protocol showed possible right pyelonephritis.  Patient is admitted to [place] bed as inpatient.  Dr. Delaine Lame of ID is consulted.  CT-renal stone: 1. Extensive right-sided perinephric and periureteric soft tissue stranding indicating  inflammation. No definite radiopaque calculus or findings of hydroureteronephrosis to indicate obstruction. Findings are concerning for potential right-sided pyelonephritis, but are nonspecific on today's noncontrast CT examination. Alternatively, recently relieved obstruction from a recently passed urinary calculus could result in a similar appearance. Correlation with urinalysis is recommended. 2. Hepatic steatosis.     Review of Systems:   General: has fevers, chills, no body weight gain, has fatigue and HA HEENT: no blurry vision, hearing changes or sore throat Respiratory: no dyspnea, coughing, wheezing CV: no chest pain, no palpitations GI: no nausea, vomiting, abdominal pain, diarrhea, constipation GU: no dysuria, burning on urination, has increased urinary frequency, no hematuria  Ext: no leg edema Neuro: no unilateral weakness, numbness, or tingling, no vision change or hearing loss. Skin: no rash, no skin tear. MSK: No muscle spasm, no deformity, no limitation of range of movement in spin Heme: No easy bruising.  Travel history: No recent long distant travel.  Allergy: No Known Allergies  Past Medical History:  Diagnosis Date   Gestational diabetes mellitus    Positive QuantiFERON-TB Gold test     Past Surgical History:  Procedure Laterality Date   CESAREAN SECTION      Social History:  reports that she has never smoked. She has never used smokeless tobacco. She reports that she does not drink alcohol and does not use drugs.  Family History:  Family History  Problem Relation Age of Onset   Hypertension Mother    Diabetes Mother    Diabetes Sister      Prior to Admission medications   Medication Sig Start Date End Date  Taking? Authorizing Provider  clotrimazole (CLOTRIMAZOLE AF) 1 % cream Apply 1 application topically 2 (two) times daily. 08/24/19   Landry Dyke, PA-C    Physical Exam: Vitals:   04/14/21 0600 04/14/21 0645 04/14/21 0700 04/14/21  0825  BP: (!) 83/66 102/60 100/60 103/61  Pulse: 87 (!) 102 (!) 103 (!) 123  Resp: 15 20 19 18   Temp:    98.1 F (36.7 C)  TempSrc:    Oral  SpO2: 100% 100% 100% 98%   General: Not in acute distress HEENT:       Eyes: PERRL, EOMI, no scleral icterus.       ENT: No discharge from the ears and nose, no pharynx injection, no tonsillar enlargement.        Neck: No JVD, no bruit, no mass felt. Heme: No neck lymph node enlargement. Cardiac: S1/S2, RRR, No murmurs, No gallops or rubs. Respiratory: No rales, wheezing, rhonchi or rubs. GI: Soft, nondistended, nontender, no rebound pain, no organomegaly, BS present. GU: No hematuria Ext: No pitting leg edema bilaterally. 1+DP/PT pulse bilaterally. Musculoskeletal: No joint deformities, No joint redness or warmth, no limitation of ROM in spin. Skin: No rashes.  Neuro: Alert, oriented X3, cranial nerves II-XII grossly intact, moves all extremities normally.  Neck is supple Psych: Patient is not psychotic, no suicidal or hemocidal ideation.  Labs on Admission: I have personally reviewed following labs and imaging studies  CBC: Recent Labs  Lab 04/14/21 0129  WBC 22.9*  NEUTROABS 19.9*  HGB 10.8*  HCT 33.3*  MCV 82.8  PLT 284   Basic Metabolic Panel: Recent Labs  Lab 04/14/21 0129  NA 137  K 3.8  CL 104  CO2 21*  GLUCOSE 170*  BUN 13  CREATININE 1.43*  CALCIUM 8.1*   GFR: CrCl cannot be calculated (Unknown ideal weight.). Liver Function Tests: Recent Labs  Lab 04/14/21 0129  AST 22  ALT 28  ALKPHOS 62  BILITOT 1.1  PROT 6.8  ALBUMIN 3.0*   No results for input(s): LIPASE, AMYLASE in the last 168 hours. No results for input(s): AMMONIA in the last 168 hours. Coagulation Profile: Recent Labs  Lab 04/14/21 0834  INR 1.4*   Cardiac Enzymes: No results for input(s): CKTOTAL, CKMB, CKMBINDEX, TROPONINI in the last 168 hours. BNP (last 3 results) No results for input(s): PROBNP in the last 8760 hours. HbA1C: No  results for input(s): HGBA1C in the last 72 hours. CBG: Recent Labs  Lab 04/14/21 0124  GLUCAP 165*   Lipid Profile: No results for input(s): CHOL, HDL, LDLCALC, TRIG, CHOLHDL, LDLDIRECT in the last 72 hours. Thyroid Function Tests: No results for input(s): TSH, T4TOTAL, FREET4, T3FREE, THYROIDAB in the last 72 hours. Anemia Panel: No results for input(s): VITAMINB12, FOLATE, FERRITIN, TIBC, IRON, RETICCTPCT in the last 72 hours. Urine analysis:    Component Value Date/Time   COLORURINE YELLOW (A) 04/14/2021 0124   APPEARANCEUR CLOUDY (A) 04/14/2021 0124   LABSPEC 1.031 (H) 04/14/2021 0124   PHURINE 5.0 04/14/2021 0124   GLUCOSEU >=500 (A) 04/14/2021 0124   HGBUR LARGE (A) 04/14/2021 0124   BILIRUBINUR NEGATIVE 04/14/2021 0124   KETONESUR 5 (A) 04/14/2021 0124   PROTEINUR 100 (A) 04/14/2021 0124   NITRITE NEGATIVE 04/14/2021 0124   LEUKOCYTESUR NEGATIVE 04/14/2021 0124   Sepsis Labs: @LABRCNTIP (procalcitonin:4,lacticidven:4) ) Recent Results (from the past 240 hour(s))  Resp Panel by RT-PCR (Flu A&B, Covid) Nasopharyngeal Swab     Status: None   Collection Time: 04/13/21  8:50 PM  Specimen: Nasopharyngeal Swab; Nasopharyngeal(NP) swabs in vial transport medium  Result Value Ref Range Status   SARS Coronavirus 2 by RT PCR NEGATIVE NEGATIVE Final    Comment: (NOTE) SARS-CoV-2 target nucleic acids are NOT DETECTED.  The SARS-CoV-2 RNA is generally detectable in upper respiratory specimens during the acute phase of infection. The lowest concentration of SARS-CoV-2 viral copies this assay can detect is 138 copies/mL. A negative result does not preclude SARS-Cov-2 infection and should not be used as the sole basis for treatment or other patient management decisions. A negative result may occur with  improper specimen collection/handling, submission of specimen other than nasopharyngeal swab, presence of viral mutation(s) within the areas targeted by this assay, and  inadequate number of viral copies(<138 copies/mL). A negative result must be combined with clinical observations, patient history, and epidemiological information. The expected result is Negative.  Fact Sheet for Patients:  EntrepreneurPulse.com.au  Fact Sheet for Healthcare Providers:  IncredibleEmployment.be  This test is no t yet approved or cleared by the [place] FDA and  has been authorized for detection and/or diagnosis of SARS-CoV-2 by FDA under an Emergency Use Authorization (EUA). This EUA will remain  in effect (meaning this test can be used) for the duration of the COVID-19 declaration under Section 564(b)(1) of the Act, 21 U.S.C.section 360bbb-3(b)(1), unless the authorization is terminated  or revoked sooner.       Influenza A by PCR NEGATIVE NEGATIVE Final   Influenza B by PCR NEGATIVE NEGATIVE Final    Comment: (NOTE) The Xpert Xpress SARS-CoV-2/FLU/RSV plus assay is intended as an aid in the diagnosis of influenza from Nasopharyngeal swab specimens and should not be used as a sole basis for treatment. Nasal washings and aspirates are unacceptable for Xpert Xpress SARS-CoV-2/FLU/RSV testing.  Fact Sheet for Patients: EntrepreneurPulse.com.au  Fact Sheet for Healthcare Providers: IncredibleEmployment.be  This test is not yet approved or cleared by the [place] FDA and has been authorized for detection and/or diagnosis of SARS-CoV-2 by FDA under an Emergency Use Authorization (EUA). This EUA will remain in effect (meaning this test can be used) for the duration of the COVID-19 declaration under Section 564(b)(1) of the Act, 21 U.S.C. section 360bbb-3(b)(1), unless the authorization is terminated or revoked.  Performed at [facility], [place], [place] [address]   CSF culture w Stat Gram Stain     Status: None (Preliminary result)   Collection Time:  04/14/21  3:35 AM   Specimen: CSF; Cerebrospinal Fluid  Result Value Ref Range Status   Specimen Description CSF  Final   Special Requests NONE  Final   Gram Stain   Final    NO ORGANISMS SEEN WBCS SEEN NO RBC SEEN CRITICAL RESULT CALLED TO, READ BACK BY AND VERIFIED WITH: [place] JAMES RN AT [id number] 04/14/21 GAA    Culture   Final    NO ORGANISMS SEEN Performed at [facility], [address] [place], [place] [address]    Report Status PENDING  Incomplete     Radiological Exams on Admission: DG Chest 2 View  Result Date: 04/14/2021 CLINICAL DATA:  Fever and chills for several days, initial encounter EXAM: CHEST - 2 VIEW COMPARISON:  10/11/2018 FINDINGS: The heart size and mediastinal contours are within normal limits. Both lungs are clear. The visualized skeletal structures are unremarkable. IMPRESSION: No active cardiopulmonary disease. Electronically Signed   By: Inez Catalina M.D.   On: 04/14/2021 01:43   CT HEAD WO CONTRAST (5MM)  Result Date: 04/14/2021 CLINICAL DATA:  Headache, intracranial hemorrhage suspected EXAM: CT HEAD WITHOUT CONTRAST TECHNIQUE: Contiguous axial images were obtained from the base of the skull through the vertex without intravenous contrast. COMPARISON:  None. FINDINGS: Brain: No evidence of acute infarction, hemorrhage, hydrocephalus, extra-axial collection or mass lesion/mass effect. Vascular: No hyperdense vessel or unexpected calcification. Skull: Normal. Negative for fracture or focal lesion. Sinuses/Orbits: No acute finding. Other: None. IMPRESSION: No acute intracranial findings. Electronically Signed   By: Davina Poke D.O.   On: 04/14/2021 09:24   CT Renal Stone Study  Result Date: 04/14/2021 CLINICAL DATA:  40 year old female with history of fever, chills and nausea. Flank pain. Suspected kidney stone. EXAM: CT ABDOMEN AND PELVIS WITHOUT CONTRAST TECHNIQUE: Multidetector CT imaging of the abdomen and pelvis was performed following the  standard protocol without IV contrast. COMPARISON:  No priors. FINDINGS: Lower chest: Unremarkable. Hepatobiliary: No suspicious cystic or solid hepatic lesions are confidently identified on today's noncontrast CT examination. Mild diffuse low attenuation throughout the hepatic parenchyma, indicative of a background of hepatic steatosis. Unenhanced appearance of the gallbladder is normal. Pancreas: No definite pancreatic mass or peripancreatic fluid collections or inflammatory changes are confidently identified on today's noncontrast CT examination. Spleen: Unremarkable. Adrenals/Urinary Tract: No definite calcifications are identified within the collecting system of either kidney, along the course of either ureter or within the lumen of the urinary bladder. Extensive perinephric stranding around the right kidney and some right-sided periureteric soft tissue stranding. No frank hydronephrosis. Left kidney, bilateral adrenal glands and urinary bladder otherwise normal in appearance. Stomach/Bowel: Unenhanced appearance of the stomach is normal. No pathologic dilatation of small bowel or colon. Normal appendix. Vascular/Lymphatic: No atherosclerotic calcifications in the abdominal aorta or pelvic vasculature. No lymphadenopathy noted in the abdomen or pelvis. Reproductive: Unenhanced appearance of the uterus and ovaries is unremarkable. Other: No significant volume of ascites.  No pneumoperitoneum. Musculoskeletal: There are no aggressive appearing lytic or blastic lesions noted in the visualized portions of the skeleton. IMPRESSION: 1. Extensive right-sided perinephric and periureteric soft tissue stranding indicating inflammation. No definite radiopaque calculus or findings of hydroureteronephrosis to indicate obstruction. Findings are concerning for potential right-sided pyelonephritis, but are nonspecific on today's noncontrast CT examination. Alternatively, recently relieved obstruction from a recently passed  urinary calculus could result in a similar appearance. Correlation with urinalysis is recommended. 2. Hepatic steatosis. Electronically Signed   By: Vinnie Langton M.D.   On: 04/14/2021 06:36     EKG:  reviewed independently, sinus rhythm, RAD, low voltage, tachycardia.  Assessment/Plan Principal Problem:   Acute pyelonephritis Active Problems:   Positive QuantiFERON-TB Gold test   AKI (acute kidney injury) ([place])   Sepsis ([place])   Headache   History of prediabetes   Sepsis due to acute pyelonephritis: Patient meets criteria for sepsis with WBC 22.9, tachycardia with heart rate 121.  Lactic acid is normal x 3.  Initially hypotensive, which responded to IV fluid resuscitation.  Currently blood pressure 103/61.  -Admitted to MedSurg bed as inpatient -IV Rocephin -Follow-up of blood culture and urine culture -will get Procalcitonin -IVF: total of 2L LR and 1.5 L of NS bolus, followed by 125 cc/h of NS  Headache: Pt has severe throbbing headache.  Etiology is not clear. May be due to migraine. Patient does not have neck rigidity.  No confusion, low suspicions of meningitis. LP was done.  CSF showed glucose 90, protein 24, negative gram stain.  WBC is 5 in first tube and 43 in 4th  tube. Consulted ID, Dr. Delaine Lame. -As needed Tylenol, Benadryl combined with Compazine, or sumatriptan as needed -Follow-up CT of head --> negative -Follow-up with Dr. Ramon Dredge recommendations  Positive QuantiFERON-TB Gold test: pt states the she was not treated  -will f/u Dr. Ramon Dredge recommendations  Mild AKI (acute kidney injury) [facility]): -IVF as above.  -Avoid using renal toxic medications  History of prediabetes: No A1c available.  Patient is taking metformin at home.  Blood sugar 170. -Sliding scale insulin-sensitive -check A1c      DVT ppx: SCD Code Status: Full code Family Communication: not done, no family member is at bed side.    Disposition Plan:  Anticipate discharge back to  previous environment Consults called:  Dr. Steva Ready of ID Admission status and Level of care: Telemetry Medical:    as inpt     Patient has multiple comorbidities. Presents with sepsis due to acute pyelonephritis, initially hypotensive, also has AKI, severe headache.  Her presentation is highly complicated.  Patient is at high risk of deteriorating.  Will need to be treated in hospital for at least 2 days.    Date of Service 04/14/2021    Ivor Costa [facility]   If 7PM-7AM, please contact night-coverage [email or web address] 04/14/2021, 9:31 AM

## 2021-04-14 NOTE — Progress Notes (Signed)
Patient continues to have bad headache. 7/10. Yellow MEWS due to HR of 124. Dr Clyde Lundborg notified and ordered a once of imitrex

## 2021-04-14 NOTE — ED Notes (Signed)
Patient transported to CT 

## 2021-04-14 NOTE — ED Notes (Signed)
Secure chat sent to Dr. Clyde Lundborg, re: pts BP advised not to give dilaudid and new orders will be placed.

## 2021-04-14 NOTE — ED Notes (Signed)
ED Provider at bedside. 

## 2021-04-15 DIAGNOSIS — E669 Obesity, unspecified: Secondary | ICD-10-CM

## 2021-04-15 DIAGNOSIS — A4151 Sepsis due to Escherichia coli [E. coli]: Principal | ICD-10-CM

## 2021-04-15 DIAGNOSIS — R519 Headache, unspecified: Secondary | ICD-10-CM

## 2021-04-15 DIAGNOSIS — Z6830 Body mass index (BMI) 30.0-30.9, adult: Secondary | ICD-10-CM

## 2021-04-15 DIAGNOSIS — G030 Nonpyogenic meningitis: Secondary | ICD-10-CM

## 2021-04-15 LAB — HSV 1/2 PCR, CSF
HSV-1 DNA: NEGATIVE
HSV-2 DNA: NEGATIVE

## 2021-04-15 LAB — GLUCOSE, CAPILLARY
Glucose-Capillary: 104 mg/dL — ABNORMAL HIGH (ref 70–99)
Glucose-Capillary: 107 mg/dL — ABNORMAL HIGH (ref 70–99)
Glucose-Capillary: 228 mg/dL — ABNORMAL HIGH (ref 70–99)
Glucose-Capillary: 237 mg/dL — ABNORMAL HIGH (ref 70–99)
Glucose-Capillary: 79 mg/dL (ref 70–99)
Glucose-Capillary: 90 mg/dL (ref 70–99)

## 2021-04-15 LAB — CBC
HCT: 28.8 % — ABNORMAL LOW (ref 36.0–46.0)
Hemoglobin: 9.1 g/dL — ABNORMAL LOW (ref 12.0–15.0)
MCH: 26.3 pg (ref 26.0–34.0)
MCHC: 31.6 g/dL (ref 30.0–36.0)
MCV: 83.2 fL (ref 80.0–100.0)
Platelets: 233 10*3/uL (ref 150–400)
RBC: 3.46 MIL/uL — ABNORMAL LOW (ref 3.87–5.11)
RDW: 15.7 % — ABNORMAL HIGH (ref 11.5–15.5)
WBC: 13 10*3/uL — ABNORMAL HIGH (ref 4.0–10.5)
nRBC: 0 % (ref 0.0–0.2)

## 2021-04-15 LAB — BASIC METABOLIC PANEL WITH GFR
Anion gap: 6 (ref 5–15)
BUN: 11 mg/dL (ref 6–20)
CO2: 19 mmol/L — ABNORMAL LOW (ref 22–32)
Calcium: 7.6 mg/dL — ABNORMAL LOW (ref 8.9–10.3)
Chloride: 113 mmol/L — ABNORMAL HIGH (ref 98–111)
Creatinine, Ser: 0.7 mg/dL (ref 0.44–1.00)
GFR, Estimated: >60 mL/min (ref >60)
Glucose, Bld: 103 mg/dL — ABNORMAL HIGH (ref 70–99)
Potassium: 3.8 mmol/L (ref 3.5–5.1)
Sodium: 138 mmol/L (ref 135–145)

## 2021-04-15 LAB — RPR: RPR Ser Ql: NONREACTIVE

## 2021-04-15 MED ORDER — BUTALBITAL-APAP-CAFFEINE 50-325-40 MG PO TABS
1.0000 | ORAL_TABLET | Freq: Four times a day (QID) | ORAL | Status: DC | PRN
  Administered 2021-04-15: 1 via ORAL
  Filled 2021-04-15: qty 1

## 2021-04-15 MED ORDER — SODIUM CHLORIDE 0.9 % IV SOLN
2.0000 g | INTRAVENOUS | Status: DC
  Administered 2021-04-16: 2 g via INTRAVENOUS
  Filled 2021-04-15: qty 2
  Filled 2021-04-15: qty 20

## 2021-04-15 MED ORDER — DEXAMETHASONE SODIUM PHOSPHATE 10 MG/ML IJ SOLN
10.0000 mg | Freq: Once | INTRAMUSCULAR | Status: AC
  Administered 2021-04-15: 10 mg via INTRAVENOUS
  Filled 2021-04-15: qty 1

## 2021-04-15 MED ORDER — MAGNESIUM SULFATE 2 GM/50ML IV SOLN
2.0000 g | Freq: Once | INTRAVENOUS | Status: AC
  Administered 2021-04-15: 2 g via INTRAVENOUS
  Filled 2021-04-15: qty 50

## 2021-04-15 NOTE — Plan of Care (Signed)

## 2021-04-15 NOTE — Progress Notes (Signed)
[name], female   DOB: 01-Jul-1980, 40 y.o.   MRN: [id number] [facility] PROGRESS NOTE  Summer Johnson DXA:[id number] DOB: 10-Aug-1980 DOA: 04/14/2021 PCP: Federico Flake, MD  HPI/Subjective: Patient coming in with headache.  No blurry vision.  Throbbing all over her head.  Worse when putting her head down.  She has been taking a little ibuprofen as outpatient.  Found to have acute kidney injury and right-sided pyelonephritis.  Objective: Vitals:   04/15/21 0352 04/15/21 0734  BP: 98/62 105/75  Pulse: 83 79  Resp: 18 17  Temp: 98.1 F (36.7 C) 97.6 F (36.4 C)  SpO2: 100% 99%    Intake/Output Summary (Last 24 hours) at 04/15/2021 1327 Last data filed at 04/15/2021 1214 Gross per 24 hour  Intake 2788.01 ml  Output --  Net 2788.01 ml   Filed Weights   04/15/21 0342  Weight: 86.4 kg    ROS: Review of Systems  Respiratory:  Negative for shortness of breath.   Cardiovascular:  Negative for chest pain.  Gastrointestinal:  Negative for abdominal pain, nausea and vomiting.  Neurological:  Positive for headaches.  Exam: Physical Exam HENT:     Head: Normocephalic.     Mouth/Throat:     Pharynx: No oropharyngeal exudate.  Eyes:     General: Lids are normal.     Extraocular Movements: Extraocular movements intact.     Right eye: No nystagmus.     Left eye: No nystagmus.     Conjunctiva/sclera: Conjunctivae normal.  Cardiovascular:     Rate and Rhythm: Normal rate and regular rhythm.     Heart sounds: Normal heart sounds, S1 normal and S2 normal.  Pulmonary:     Breath sounds: No decreased breath sounds, wheezing, rhonchi or rales.  Abdominal:     Palpations: Abdomen is soft.     Tenderness: There is no abdominal tenderness.  Musculoskeletal:     Right lower leg: No swelling.     Left lower leg: No swelling.  Skin:    General: Skin is warm.     Findings: No rash.  Neurological:     Mental Status: She is alert and oriented to person,  place, and time.     Comments: Power 5 out of 5 bilaterally.      Scheduled Meds:  insulin aspart  0-9 Units Subcutaneous Q4H   Continuous Infusions:  sodium chloride 50 mL/hr at 04/15/21 1207   [START ON 04/16/2021] cefTRIAXone (ROCEPHIN)  IV     Brief history 40 year old female with history of type 2 diabetes mellitus on metformin presents with headache.  Lumbar puncture done in the emergency room consistent with aseptic meningitis.  This could be secondary to ibuprofen.  Patient also found to have right-sided pyelonephritis and clinical sepsis. Assessment/Plan:  Clinical sepsis, present on admission with right-sided acute pyelonephritis.  Patient on Rocephin.  Urine culture growing E. coli.  CT scan showing right-sided perinephric stranding.  Initially had tachycardia and leukocytosis and low-grade temperature and acute kidney injury Aseptic meningitis.  Could be secondary to ibuprofen.  Avoiding NSAIDs at this point. Headache.  We will give IV magnesium and a dose of Decadron and see if that helps.  As needed Fioricet. Acute kidney injury.  Creatinine improved from 1.43 down to 0.70. Type 2 diabetes mellitus.  Hemoglobin A1c low at 4.9.  We will get rid of Glucophage at this point. Obesity with a BMI of 33.74 History of positive QuantiFERON TB Gold test.  Infectious disease ordered  a repeat test.  Chest x-ray does not show any active TB      Code Status:     Code Status Orders  (From admission, onward)           Start     Ordered   04/14/21 0731  Full code  Continuous        04/14/21 0731           Code Status History     Date Active Date Inactive Code Status Order ID Comments User Context   01/16/2019 1404 01/16/2019 2103 Full Code [id number]  McVey, Prudencio Pair, CNM Inpatient       Disposition Plan: Status is: Inpatient  Consultants: Infectious disease  Antibiotics: Rocephin  Time spent: 27 minutes  Neylan Koroma [employer]

## 2021-04-15 NOTE — Progress Notes (Signed)
   04/14/21 1530  Assess: MEWS Score  Temp 98.3 F (36.8 C)  BP 120/77  Pulse Rate (!) 124  Resp 18  SpO2 99 %  O2 Device Room Air  Assess: MEWS Score  MEWS Temp 0  MEWS Systolic 0  MEWS Pulse 2  MEWS RR 0  MEWS LOC 0  MEWS Score 2  MEWS Score Color Yellow  Assess: if the MEWS score is Yellow or Red  Were vital signs taken at a resting state? Yes  Focused Assessment No change from prior assessment  Does the patient meet 2 or more of the SIRS criteria? No  MEWS guidelines implemented *See Row Information* Yes  Treat  MEWS Interventions Escalated (See documentation below)  Take Vital Signs  Increase Vital Sign Frequency  Yellow: Q 2hr X 2 then Q 4hr X 2, if remains yellow, continue Q 4hrs  Escalate  MEWS: Escalate Yellow: discuss with charge nurse/RN and consider discussing with provider and RRT  Notify: Charge Nurse/RN  Name of Charge Nurse/RN Notified Susy [place] (Discussed with Joy RN)  Date Charge Nurse/RN Notified 04/14/21  Time Charge Nurse/RN Notified 1535  Notify: Provider  Provider Name/Title Dr Clyde Lundborg  Date Provider Notified 04/14/21  Time Provider Notified 1530  Notification Type Face-to-face  Notification Reason Other (Comment) (Yellow MEWS)  Provider response No new orders  Date of Provider Response 04/14/21  Time of Provider Response 1536  Assess: SIRS CRITERIA  SIRS Temperature  0  SIRS Pulse 1  SIRS Respirations  0  SIRS WBC 0  SIRS Score Sum  1

## 2021-04-15 NOTE — Progress Notes (Signed)
Patient laying in bed, no acute distress noted.  No significant changes noted in patient's status.  Agree with Edward Jolly, RN on AM assessment.  Will report off to oncoming staff.

## 2021-04-15 NOTE — Progress Notes (Addendum)
Date of Admission:  04/14/2021     ID: Summer Johnson is a 40 y.o. female  Principal Problem:   Acute pyelonephritis Active Problems:   Class 1 obesity without serious comorbidity with body mass index (BMI) of 30.0 to 30.9 in adult   Positive QuantiFERON-TB Gold test   AKI (acute kidney injury) (HCC)   Sepsis (HCC)   Headache   History of prediabetes   Aseptic meningitis    Subjective: Pt feeling much better No fever Headache much improved  Medications:   insulin aspart  0-9 Units Subcutaneous Q4H    Objective: Vital signs in last 24 hours: Temp:  [97.6 F (36.4 C)-98.9 F (37.2 C)] 97.6 F (36.4 C) (11/22 0734) Pulse Rate:  [79-124] 79 (11/22 0734) Resp:  [17-19] 17 (11/22 0734) BP: (85-123)/(51-80) 105/75 (11/22 0734) SpO2:  [97 %-100 %] 99 % (11/22 0734) Weight:  [86.4 kg] 86.4 kg (11/22 0342)  PHYSICAL EXAM:  General: Alert, cooperative, no distress, appears stated age.  Head: Normocephalic, without obvious abnormality, atraumatic. Eyes: Conjunctivae clear, anicteric sclerae. Pupils are equal ENT Nares normal. No drainage or sinus tenderness. Lips, mucosa, and tongue normal. No Thrush Neck: Supple, symmetrical, no adenopathy, thyroid: non tender no carotid bruit and no JVD. Back: No CVA tenderness. Lungs: Clear to auscultation bilaterally. No Wheezing or Rhonchi. No rales. Heart: Regular rate and rhythm, no murmur, rub or gallop. Abdomen: Soft, non-tender,not distended. Bowel sounds normal. No masses Extremities: atraumatic, no cyanosis. No edema. No clubbing Skin: No rashes or lesions. Or bruising Lymph: Cervical, supraclavicular normal. Neurologic: Grossly non-focal  Lab Results Recent Labs    04/14/21 0129 04/15/21 0316  WBC 22.9* 13.0*  HGB 10.8* 9.1*  HCT 33.3* 28.8*  NA 137 138  K 3.8 3.8  CL 104 113*  CO2 21* 19*  BUN 13 11  CREATININE 1.43* 0.70   Liver Panel Recent Labs    04/14/21 0129  PROT 6.8  ALBUMIN 3.0*  AST 22  ALT 28   ALKPHOS 62  BILITOT 1.1   Sedimentation Rate No results for input(s): ESRSEDRATE in the last 72 hours. C-Reactive Protein No results for input(s): CRP in the last 72 hours.  Microbiology: Urine culture- e.coli Studies/Results: DG Chest 2 View  Result Date: 04/14/2021 CLINICAL DATA:  Fever and chills for several days, initial encounter EXAM: CHEST - 2 VIEW COMPARISON:  10/11/2018 FINDINGS: The heart size and mediastinal contours are within normal limits. Both lungs are clear. The visualized skeletal structures are unremarkable. IMPRESSION: No active cardiopulmonary disease. Electronically Signed   By: Alcide Clever M.D.   On: 04/14/2021 01:43   CT HEAD WO CONTRAST ( )  Result Date: 04/14/2021 CLINICAL DATA:  Headache, intracranial hemorrhage suspected EXAM: CT HEAD WITHOUT CONTRAST TECHNIQUE: Contiguous axial images were obtained from the base of the skull through the vertex without intravenous contrast. COMPARISON:  None. FINDINGS: Brain: No evidence of acute infarction, hemorrhage, hydrocephalus, extra-axial collection or mass lesion/mass effect. Vascular: No hyperdense vessel or unexpected calcification. Skull: Normal. Negative for fracture or focal lesion. Sinuses/Orbits: No acute finding. Other: None. IMPRESSION: No acute intracranial findings. Electronically Signed   By: Duanne Guess D.O.   On: 04/14/2021 09:24   CT Renal Stone Study  Result Date: 04/14/2021 CLINICAL DATA:  40 year old female with history of fever, chills and nausea. Flank pain. Suspected kidney stone. EXAM: CT ABDOMEN AND PELVIS WITHOUT CONTRAST TECHNIQUE: Multidetector CT imaging of the abdomen and pelvis was performed following the standard protocol without IV contrast. COMPARISON:  No priors. FINDINGS: Lower chest: Unremarkable. Hepatobiliary: No suspicious cystic or solid hepatic lesions are confidently identified on today's noncontrast CT examination. Mild diffuse low attenuation throughout the hepatic  parenchyma, indicative of a background of hepatic steatosis. Unenhanced appearance of the gallbladder is normal. Pancreas: No definite pancreatic mass or peripancreatic fluid collections or inflammatory changes are confidently identified on today's noncontrast CT examination. Spleen: Unremarkable. Adrenals/Urinary Tract: No definite calcifications are identified within the collecting system of either kidney, along the course of either ureter or within the lumen of the urinary bladder. Extensive perinephric stranding around the right kidney and some right-sided periureteric soft tissue stranding. No frank hydronephrosis. Left kidney, bilateral adrenal glands and urinary bladder otherwise normal in appearance. Stomach/Bowel: Unenhanced appearance of the stomach is normal. No pathologic dilatation of small bowel or colon. Normal appendix. Vascular/Lymphatic: No atherosclerotic calcifications in the abdominal aorta or pelvic vasculature. No lymphadenopathy noted in the abdomen or pelvis. Reproductive: Unenhanced appearance of the uterus and ovaries is unremarkable. Other: No significant volume of ascites.  No pneumoperitoneum. Musculoskeletal: There are no aggressive appearing lytic or blastic lesions noted in the visualized portions of the skeleton. IMPRESSION: 1. Extensive right-sided perinephric and periureteric soft tissue stranding indicating inflammation. No definite radiopaque calculus or findings of hydroureteronephrosis to indicate obstruction. Findings are concerning for potential right-sided pyelonephritis, but are nonspecific on today's noncontrast CT examination. Alternatively, recently relieved obstruction from a recently passed urinary calculus could result in a similar appearance. Correlation with urinalysis is recommended. 2. Hepatic steatosis. Electronically Signed   By: Vinnie Langton M.D.   On: 04/14/2021 06:36     Assessment/Plan: Rt pyelonephritis- e.coli UTI Leucocytosis improved Fever  resolved Continue ceftiaxone Await susceptibility to decide on Po antibiotic  AKI resolved  Aseptic meningitis like picture likely due to NSAID  Headache- likely at baseline she has migraine , but now could be aggravated by infection, LP. Better with decadron and magnesium   History of positive quantiferon gold in 2020- dont see the result in epic or care everywhere- repeated test- can be treated as OP if needed- She is not sure about treatment CXR no active lung disease  Discussed the management with the patient and hospitalist

## 2021-04-16 DIAGNOSIS — N12 Tubulo-interstitial nephritis, not specified as acute or chronic: Secondary | ICD-10-CM

## 2021-04-16 LAB — GLUCOSE, CAPILLARY
Glucose-Capillary: 214 mg/dL — ABNORMAL HIGH (ref 70–99)
Glucose-Capillary: 156 mg/dL — ABNORMAL HIGH (ref 70–99)
Glucose-Capillary: 141 mg/dL — ABNORMAL HIGH (ref 70–99)
Glucose-Capillary: 154 mg/dL — ABNORMAL HIGH (ref 70–99)
Glucose-Capillary: 176 mg/dL — ABNORMAL HIGH (ref 70–99)

## 2021-04-16 LAB — URINE CULTURE
Culture: >=100000 — AB
Culture: >=100000 — AB

## 2021-04-16 MED ORDER — ENOXAPARIN SODIUM 60 MG/0.6ML IJ SOSY: 0.5000 mg/kg | PREFILLED_SYRINGE | INTRAMUSCULAR | Status: DC

## 2021-04-16 MED ORDER — CEFADROXIL 1 G PO TABS: 1.0000 g | ORAL_TABLET | Freq: Two times a day (BID) | ORAL | 0 refills | Status: AC

## 2021-04-16 NOTE — Progress Notes (Signed)
   Date of Admission:  04/14/2021     ID: Summer Johnson is a 40 y.o. female  Principal Problem:   Acute pyelonephritis Active Problems:   Class 1 obesity without serious comorbidity with body mass index (BMI) of 30.0 to 30.9 in adult   Positive QuantiFERON-TB Gold test   AKI (acute kidney injury) (HCC)   Sepsis (HCC)   Headache   History of prediabetes   Aseptic meningitis    Subjective: Doing well Says headache is almost not there No fever   Medications:   insulin aspart  0-9 Units Subcutaneous Q4H    Objective: Vital signs in last 24 hours: Temp:  [97.5 F (36.4 C)-98.6 F (37 C)] 98.2 F (36.8 C) (11/23 0810) Pulse Rate:  [50-94] 50 (11/23 0810) Resp:  [18] 18 (11/23 0810) BP: (95-140)/(67-87) 140/79 (11/23 0810) SpO2:  [97 %-99 %] 99 % (11/23 0810)  PHYSICAL EXAM:  General: Alert, cooperative, no distress, appears stated age.  Head: Normocephalic, without obvious abnormality, atraumatic. Eyes: Conjunctivae clear, anicteric sclerae. Pupils are equal ENT Nares normal. No drainage or sinus tenderness. Lips, mucosa, and tongue normal. No Thrush Neck: Supple, symmetrical, no adenopathy, thyroid: non tender no carotid bruit and no JVD. Back: No CVA tenderness. Lungs: Clear to auscultation bilaterally. No Wheezing or Rhonchi. No rales. Heart: Regular rate and rhythm, no murmur, rub or gallop. Abdomen: Soft, non-tender,not distended. Bowel sounds normal. No masses Extremities: atraumatic, no cyanosis. No edema. No clubbing Skin: No rashes or lesions. Or bruising Lymph: Cervical, supraclavicular normal. Neurologic: Grossly non-focal  Lab Results Recent Labs    04/14/21 0129 04/15/21 0316  WBC 22.9* 13.0*  HGB 10.8* 9.1*  HCT 33.3* 28.8*  NA 137 138  K 3.8 3.8  CL 104 113*  CO2 21* 19*  BUN 13 11  CREATININE 1.43* 0.70   Liver Panel Recent Labs    04/14/21 0129  PROT 6.8  ALBUMIN 3.0*  AST 22  ALT 28  ALKPHOS 62  BILITOT 1.1   Sedimentation  Rate No results for input(s): ESRSEDRATE in the last 72 hours. C-Reactive Protein No results for input(s): CRP in the last 72 hours.  Microbiology: Urine culture- e.coli Studies/Results: No results found.   Assessment/Plan: Rt pyelonephritis- e.coli UTI Leucocytosis improved- she did receive decadron one dose which could keep it high Fever resolved On ceftriaxone- highly susceptible e.coli- so could be changed to cefadroxil 1 gram PO BID for 8 more days on discharge  AKI resolved  Aseptic meningitis like picture likely due to NSAID  Headache- likely at baseline she has migraine , but now could be aggravated by infection, LP. Better with decadron and magnesium   History of positive quantiferon gold in 2020- dont see the result in epic or care everywhere- repeated test-result still pending   will follow her as OP- 04/29/21 at 11.15 am to follow on the quantiferon gold test  CXR no active lung disease  Discussed the management with the patient and hospitalist ID will sign off- call if needed

## 2021-04-16 NOTE — Care Plan (Signed)
Patient discharged with discharge instructions. PIV  x 1 removed. Patient given discharge instructions with the assistance of spanish interpreter. No questions asked. Patient escorted to lobby via volunteer.

## 2021-04-16 NOTE — Plan of Care (Signed)

## 2021-04-16 NOTE — Discharge Summary (Signed)
Physician Discharge Summary  Summer Johnson FBP:[id number] DOB: 02-Feb-1981 DOA: 04/14/2021  PCP: Summer Macadam, MD  Admit date: 04/14/2021 Discharge date: 04/16/2021  Admitted From: home  Disposition:  home   Recommendations for Outpatient Follow-up:  Follow up with PCP in 1-2 weeks   Home Health: no  Equipment/Devices:  Discharge Condition: stable  CODE STATUS: full  Diet recommendation: Carb Modified  Brief/Interim Summary: HPI was taken from Dr. Blaine Hamper: Summer Johnson is a 40 y.o. 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   Pt speaks Spanish, history is obtained with help of iPad interpreter. Patient states that she has fever, chills, headache for more than 4 days.  Her headache is diffuse, mainly on the top of her head, throbbing severe headache, nonradiating.  No neck pain or neck stiffness.  Patient also reports increased urinary frequency, no dysuria, burning on urination or hematuria.  Denies flank pain or back pain.  She has nausea, no vomiting, diarrhea or abdominal pain.  Denies chest pain, cough or shortness breath.   Patient was initially hypotensive with blood pressure 80/53, which improved to 103/61 after giving total of 3 L of IV fluid bolus in ED.    ED Course: pt was found to have WBC 22.9, lactic acid 1.3, 1.9, 1.3, negative pregnancy test, positive urinalysis (cloudy appearance, negative leukocyte, many bacteria, WBC 21-50, RBC> 50), AKI with creatinine 1.43 and BUN 13 (creatinine 0.5 on 01/17/2019), temperature 99.7, chest x-ray negative.  LP was performed by EDP.  Initial urinalysis showed glucose 90, protein 24, negative Gram staining, first tube with WBC 5, 4th tube with WBC 43.  CT per renal stone protocol showed possible right pyelonephritis.  Patient is admitted to [facility] as  inpatient.  Dr. Delaine Lame of ID is consulted.   CT-renal stone: 1. Extensive right-sided perinephric and periureteric soft tissue stranding indicating inflammation. No definite radiopaque calculus or findings of hydroureteronephrosis to indicate obstruction. Findings are concerning for potential right-sided pyelonephritis, but are nonspecific on today's noncontrast CT examination. Alternatively, recently relieved obstruction from a recently passed urinary calculus could result in a similar appearance. Correlation with urinalysis is recommended. 2. Hepatic steatosis.   Hospital course from Dr. Jimmye Norman 04/16/21: Pt presented w/ sepsis secondary to right sided acute pyelonephritis. Urine cx grew e. coli. Pt was treated w/ IV rocephin and switched to po cefadroxil 1g BID x 8 days as per ID. Of note, pt was found to have aseptic meningitis and CSF cx showed no growth x 2 days. Pt was able to ambulate and transfer independently so PT/OT were not needed. Also, pt evidently had hx of positive quantiferon TB gold test and this was repeated by ID. Pt can receive treatment as an outpatient if needed as per ID. CXR did not show any active TB. For more information, please see previous progress/consult notes.   Discharge Diagnoses:  Principal Problem:   Acute pyelonephritis Active Problems:   Class 1 obesity without serious comorbidity with body mass index (BMI) of 30.0 to 30.9 in adult   Positive QuantiFERON-TB Gold test   AKI (acute kidney injury) ([place])   Sepsis (HCC)   Headache   History of prediabetes   Aseptic meningitis   Sepsis: met criteria w/ tachycardia, leukocytosis & pyelonephritis. Urine cx growing e. coli. Continue on IV rocephin as per ID.   Right sided acute pyelonephritis: urine cx growing  e. Coli. Continue on IV rocephin as per ID   Aseptic meningitis: possibly secondary to ibuprofen.  No NSAIDs  Headache: s/p IV mag & decadron. Fioricet prn     AKI:  resolved  DM2: well  controlled, HbA1c 4.9.  Diet controlled  Obesity: BMI 33.7. Would benefit from weight loss   Hx of positive quantiferon TB gold test: CXR does not show any active TB. Repeat quantiferon gold is pending   Discharge Instructions  Discharge Instructions     Diet Carb Modified   Complete by: As directed    Discharge instructions   Complete by: As directed    F/u w/ PCP in 1-2 weeks   Increase activity slowly   Complete by: As directed       Allergies as of 04/16/2021   No Known Allergies      Medication List     TAKE these medications    cefadroxil 1 g tablet Commonly known as: DURICEF Take 1 tablet (1 g total) by mouth 2 (two) times daily for 8 days.   metFORMIN 500 MG tablet Commonly known as: GLUCOPHAGE Take 1 tablet by mouth 2 (two) times daily.        No Known Allergies  Consultations: ID   Procedures/Studies: DG Chest 2 View  Result Date: 04/14/2021 CLINICAL DATA:  Fever and chills for several days, initial encounter EXAM: CHEST - 2 VIEW COMPARISON:  10/11/2018 FINDINGS: The heart size and mediastinal contours are within normal limits. Both lungs are clear. The visualized skeletal structures are unremarkable. IMPRESSION: No active cardiopulmonary disease. Electronically Signed   By: Inez Catalina M.D.   On: 04/14/2021 01:43   CT HEAD WO CONTRAST (5MM)  Result Date: 04/14/2021 CLINICAL DATA:  Headache, intracranial hemorrhage suspected EXAM: CT HEAD WITHOUT CONTRAST TECHNIQUE: Contiguous axial images were obtained from the base of the skull through the vertex without intravenous contrast. COMPARISON:  None. FINDINGS: Brain: No evidence of acute infarction, hemorrhage, hydrocephalus, extra-axial collection or mass lesion/mass effect. Vascular: No hyperdense vessel or unexpected calcification. Skull: Normal. Negative for fracture or focal lesion. Sinuses/Orbits: No acute finding. Other: None. IMPRESSION: No acute intracranial findings. Electronically Signed    By: Davina Poke D.O.   On: 04/14/2021 09:24   CT Renal Stone Study  Result Date: 04/14/2021 CLINICAL DATA:  40 year old female with history of fever, chills and nausea. Flank pain. Suspected kidney stone. EXAM: CT ABDOMEN AND PELVIS WITHOUT CONTRAST TECHNIQUE: Multidetector CT imaging of the abdomen and pelvis was performed following the standard protocol without IV contrast. COMPARISON:  No priors. FINDINGS: Lower chest: Unremarkable. Hepatobiliary: No suspicious cystic or solid hepatic lesions are confidently identified on today's noncontrast CT examination. Mild diffuse low attenuation throughout the hepatic parenchyma, indicative of a background of hepatic steatosis. Unenhanced appearance of the gallbladder is normal. Pancreas: No definite pancreatic mass or peripancreatic fluid collections or inflammatory changes are confidently identified on today's noncontrast CT examination. Spleen: Unremarkable. Adrenals/Urinary Tract: No definite calcifications are identified within the collecting system of either kidney, along the course of either ureter or within the lumen of the urinary bladder. Extensive perinephric stranding around the right kidney and some right-sided periureteric soft tissue stranding. No frank hydronephrosis. Left kidney, bilateral adrenal glands and urinary bladder otherwise normal in appearance. Stomach/Bowel: Unenhanced appearance of the stomach is normal. No pathologic dilatation of small bowel or colon. Normal appendix. Vascular/Lymphatic: No atherosclerotic calcifications in the abdominal aorta or pelvic vasculature. No lymphadenopathy noted in the abdomen or pelvis. Reproductive: Unenhanced  appearance of the uterus and ovaries is unremarkable. Other: No significant volume of ascites.  No pneumoperitoneum. Musculoskeletal: There are no aggressive appearing lytic or blastic lesions noted in the visualized portions of the skeleton. IMPRESSION: 1. Extensive right-sided perinephric and  periureteric soft tissue stranding indicating inflammation. No definite radiopaque calculus or findings of hydroureteronephrosis to indicate obstruction. Findings are concerning for potential right-sided pyelonephritis, but are nonspecific on today's noncontrast CT examination. Alternatively, recently relieved obstruction from a recently passed urinary calculus could result in a similar appearance. Correlation with urinalysis is recommended. 2. Hepatic steatosis. Electronically Signed   By: Vinnie Langton M.D.   On: 04/14/2021 06:36   (Echo, Carotid, EGD, Colonoscopy, ERCP)    Subjective: Pt c/o fatigue    Discharge Exam: Vitals:   04/16/21 0433 04/16/21 0810  BP: 99/87 140/79  Pulse: (!) 52 (!) 50  Resp: 18 18  Temp: 98 F (36.7 C) 98.2 F (36.8 C)  SpO2: 98% 99%   Vitals:   04/15/21 1456 04/15/21 2100 04/16/21 0433 04/16/21 0810  BP: 1'17/81 95/67 99/87 ' 140/79  Pulse: 94 68 (!) 52 (!) 50  Resp: '18 18 18 18  ' Temp: (!) 97.5 F (36.4 C) 98.6 F (37 C) 98 F (36.7 C) 98.2 F (36.8 C)  TempSrc: Oral Oral Oral Oral  SpO2: 98% 97% 98% 99%  Weight:      Height:        General: Pt is alert, awake, not in acute distress Cardiovascular:  S1/S2 +, no rubs, no gallops Respiratory: CTA bilaterally, no wheezing, no rhonchi Abdominal: Soft, NT, obese, bowel sounds + Extremities: no edema, no cyanosis    The results of significant diagnostics from this hospitalization (including imaging, microbiology, ancillary and laboratory) are listed below for reference.     Microbiology: Recent Results (from the past 240 hour(s))  Resp Panel by RT-PCR (Flu A&B, Covid) Nasopharyngeal Swab     Status: None   Collection Time: 04/13/21  8:50 PM   Specimen: Nasopharyngeal Swab; Nasopharyngeal(NP) swabs in vial transport medium  Result Value Ref Range Status   SARS Coronavirus 2 by RT PCR NEGATIVE NEGATIVE Final    Comment: (NOTE) SARS-CoV-2 target nucleic acids are NOT DETECTED.  The  SARS-CoV-2 RNA is generally detectable in upper respiratory specimens during the acute phase of infection. The lowest concentration of SARS-CoV-2 viral copies this assay can detect is 138 copies/mL. A negative result does not preclude SARS-Cov-2 infection and should not be used as the sole basis for treatment or other patient management decisions. A negative result may occur with  improper specimen collection/handling, submission of specimen other than nasopharyngeal swab, presence of viral mutation(s) within the areas targeted by this assay, and inadequate number of viral copies(<138 copies/mL). A negative result must be combined with clinical observations, patient history, and epidemiological information. The expected result is Negative.  Fact Sheet for Patients:  EntrepreneurPulse.com.au  Fact Sheet for Healthcare Providers:  IncredibleEmployment.be  This test is no t yet approved or cleared by the [place] FDA and  has been authorized for detection and/or diagnosis of SARS-CoV-2 by FDA under an Emergency Use Authorization (EUA). This EUA will remain  in effect (meaning this test can be used) for the duration of the COVID-19 declaration under Section 564(b)(1) of the Act, 21 U.S.C.section 360bbb-3(b)(1), unless the authorization is terminated  or revoked sooner.       Influenza A by PCR NEGATIVE NEGATIVE Final   Influenza B by PCR NEGATIVE NEGATIVE Final  Comment: (NOTE) The Xpert Xpress SARS-CoV-2/FLU/RSV plus assay is intended as an aid in the diagnosis of influenza from Nasopharyngeal swab specimens and should not be used as a sole basis for treatment. Nasal washings and aspirates are unacceptable for Xpert Xpress SARS-CoV-2/FLU/RSV testing.  Fact Sheet for Patients: EntrepreneurPulse.com.au  Fact Sheet for Healthcare Providers: IncredibleEmployment.be  This test is not yet approved or  cleared by the [place] FDA and has been authorized for detection and/or diagnosis of SARS-CoV-2 by FDA under an Emergency Use Authorization (EUA). This EUA will remain in effect (meaning this test can be used) for the duration of the COVID-19 declaration under Section 564(b)(1) of the Act, 21 U.S.C. section 360bbb-3(b)(1), unless the authorization is terminated or revoked.  Performed at [facility], [address] [place], [place] [address]   Urine Culture     Status: Abnormal   Collection Time: 04/14/21  1:29 AM   Specimen: Urine, Clean Catch  Result Value Ref Range Status   Specimen Description   Final    URINE, CLEAN CATCH Performed at [facility], [address] [place], [place] [address]    Special Requests   Final    NONE Performed at [facility], [address] [place], [place] [address]    Culture >=100,000 COLONIES/mL ESCHERICHIA COLI (A)  Final   Report Status 04/16/2021 FINAL  Final   Organism ID, Bacteria ESCHERICHIA COLI (A)  Final      Susceptibility   Escherichia coli - MIC*    AMPICILLIN <=2 SENSITIVE Sensitive     CEFAZOLIN <=4 SENSITIVE Sensitive     CEFEPIME <=0.12 SENSITIVE Sensitive     CEFTRIAXONE <=0.25 SENSITIVE Sensitive     CIPROFLOXACIN <=0.25 SENSITIVE Sensitive     GENTAMICIN <=1 SENSITIVE Sensitive     IMIPENEM <=0.25 SENSITIVE Sensitive     NITROFURANTOIN <=16 SENSITIVE Sensitive     TRIMETH/SULFA <=20 SENSITIVE Sensitive     AMPICILLIN/SULBACTAM <=2 SENSITIVE Sensitive     PIP/TAZO <=4 SENSITIVE Sensitive     * >=100,000 COLONIES/mL ESCHERICHIA COLI  Urine Culture     Status: Abnormal   Collection Time: 04/14/21  1:29 AM   Specimen: Urine, Clean Catch  Result Value Ref Range Status   Specimen Description   Final    URINE, CLEAN CATCH Performed at [facility], [address] [place],  [address]    Special Requests   Final    NONE Performed at [facility], [place],  [address]    Culture >=100,000 COLONIES/mL ESCHERICHIA COLI (A)  Final   Report Status 04/16/2021 FINAL  Final   Organism ID, Bacteria ESCHERICHIA COLI (A)  Final      Susceptibility   Escherichia coli - MIC*    AMPICILLIN <=2 SENSITIVE Sensitive     CEFAZOLIN <=4 SENSITIVE Sensitive     CEFEPIME <=0.12 SENSITIVE Sensitive     CEFTRIAXONE <=0.25 SENSITIVE Sensitive     CIPROFLOXACIN <=0.25 SENSITIVE Sensitive     GENTAMICIN <=1 SENSITIVE Sensitive     IMIPENEM <=0.25 SENSITIVE Sensitive     NITROFURANTOIN <=16 SENSITIVE Sensitive     TRIMETH/SULFA <=20 SENSITIVE Sensitive     AMPICILLIN/SULBACTAM <=2 SENSITIVE Sensitive     PIP/TAZO <=4 SENSITIVE Sensitive     * >=100,000 COLONIES/mL ESCHERICHIA COLI  Blood culture (routine x 2)     Status: None (Preliminary result)   Collection Time: 04/14/21  2:35 AM   Specimen: BLOOD  Result Value  Ref Range Status   Specimen Description BLOOD FA  Final   Special Requests   Final    BOTTLES DRAWN AEROBIC AND ANAEROBIC Blood Culture adequate volume   Culture   Final    NO GROWTH 2 DAYS Performed at [facility], [address] [place], [place] [address]    Report Status PENDING  Incomplete  Blood culture (routine x 2)     Status: None (Preliminary result)   Collection Time: 04/14/21  2:35 AM   Specimen: BLOOD  Result Value Ref Range Status   Specimen Description BLOOD LAC  Final   Special Requests   Final    BOTTLES DRAWN AEROBIC AND ANAEROBIC Blood Culture adequate volume   Culture   Final    NO GROWTH 2 DAYS Performed at [facility], [address], [place], [place] [address]    Report Status PENDING  Incomplete  CSF culture w Stat Gram Stain     Status: None (Preliminary result)   Collection Time: 04/14/21  3:35 AM   Specimen: CSF; Cerebrospinal Fluid  Result Value Ref Range Status   Specimen Description   Final    CSF Performed at [facility], [address]  [place], [place] [address]    Special Requests   Final    NONE Performed at [facility], [address] [place], [place] [address]    Gram Stain   Final    NO ORGANISMS SEEN WBCS SEEN NO RBC SEEN CRITICAL RESULT CALLED TO, READ BACK BY AND VERIFIED WITH: [place] JAMES RN AT 8022 04/14/21 GAA Performed at [facility], [address] [place], [place] [address]    Culture   Final    NO GROWTH 2 DAYS Performed at [facility], [place] [address] [place],  [address]    Report Status PENDING  Incomplete     Labs: BNP (last 3 results) No results for input(s): BNP in the last 8760 hours. Basic Metabolic Panel: Recent Labs  Lab 04/14/21 0129 04/15/21 0316  NA 137 138  K 3.8 3.8  CL 104 113*  CO2 21* 19*  GLUCOSE 170* 103*  BUN 13 11  CREATININE 1.43* 0.70  CALCIUM 8.1* 7.6*   Liver Function Tests: Recent Labs  Lab 04/14/21 0129  AST 22  ALT 28  ALKPHOS 62  BILITOT 1.1  PROT 6.8  ALBUMIN 3.0*   No results for input(s): LIPASE, AMYLASE in the last 168 hours. No results for input(s): AMMONIA in the last 168 hours. CBC: Recent Labs  Lab 04/14/21 0129 04/15/21 0316  WBC 22.9* 13.0*  NEUTROABS 19.9*  --   HGB 10.8* 9.1*  HCT 33.3* 28.8*  MCV 82.8 83.2  PLT 284 233   Cardiac Enzymes: No results for input(s): CKTOTAL, CKMB, CKMBINDEX, TROPONINI in the last 168 hours. BNP: Invalid input(s): POCBNP CBG: Recent Labs  Lab 04/15/21 2123 04/15/21 2343 04/16/21 0432 04/16/21 0808 04/16/21 1157  GLUCAP 228* 214* 156* 141* 154*   D-Dimer No results for input(s): DDIMER in the last 72 hours. Hgb A1c Recent Labs    04/14/21 0129  HGBA1C 4.9   Lipid Profile No results for input(s): CHOL, HDL, LDLCALC, TRIG, CHOLHDL, LDLDIRECT in the last 72 hours. Thyroid function studies No results for input(s): TSH, T4TOTAL, T3FREE, THYROIDAB in the last 72 hours.  Invalid input(s): FREET3 Anemia work up No results for input(s): VITAMINB12,  FOLATE, FERRITIN, TIBC, IRON, RETICCTPCT in the last 72 hours. Urinalysis  Component Value Date/Time   COLORURINE YELLOW (A) 04/14/2021 0124   APPEARANCEUR CLOUDY (A) 04/14/2021 0124   LABSPEC 1.031 (H) 04/14/2021 0124   PHURINE 5.0 04/14/2021 0124   GLUCOSEU >=500 (A) 04/14/2021 0124   HGBUR LARGE (A) 04/14/2021 0124   BILIRUBINUR NEGATIVE 04/14/2021 0124   KETONESUR 5 (A) 04/14/2021 0124   PROTEINUR 100 (A) 04/14/2021 0124   NITRITE NEGATIVE 04/14/2021 0124   LEUKOCYTESUR NEGATIVE 04/14/2021 0124   Sepsis Labs Invalid input(s): PROCALCITONIN,  WBC,  LACTICIDVEN Microbiology Recent Results (from the past 240 hour(s))  Resp Panel by RT-PCR (Flu A&B, Covid) Nasopharyngeal Swab     Status: None   Collection Time: 04/13/21  8:50 PM   Specimen: Nasopharyngeal Swab; Nasopharyngeal(NP) swabs in vial transport medium  Result Value Ref Range Status   SARS Coronavirus 2 by RT PCR NEGATIVE NEGATIVE Final    Comment: (NOTE) SARS-CoV-2 target nucleic acids are NOT DETECTED.  The SARS-CoV-2 RNA is generally detectable in upper respiratory specimens during the acute phase of infection. The lowest concentration of SARS-CoV-2 viral copies this assay can detect is 138 copies/mL. A negative result does not preclude SARS-Cov-2 infection and should not be used as the sole basis for treatment or other patient management decisions. A negative result may occur with  improper specimen collection/handling, submission of specimen other than nasopharyngeal swab, presence of viral mutation(s) within the areas targeted by this assay, and inadequate number of viral copies(<138 copies/mL). A negative result must be combined with clinical observations, patient history, and epidemiological information. The expected result is Negative.  Fact Sheet for Patients:  EntrepreneurPulse.com.au  Fact Sheet for Healthcare Providers:  IncredibleEmployment.be  This test is no t  yet approved or cleared by the [place] FDA and  has been authorized for detection and/or diagnosis of SARS-CoV-2 by FDA under an Emergency Use Authorization (EUA). This EUA will remain  in effect (meaning this test can be used) for the duration of the COVID-19 declaration under Section 564(b)(1) of the Act, 21 U.S.C.section 360bbb-3(b)(1), unless the authorization is terminated  or revoked sooner.       Influenza A by PCR NEGATIVE NEGATIVE Final   Influenza B by PCR NEGATIVE NEGATIVE Final    Comment: (NOTE) The Xpert Xpress SARS-CoV-2/FLU/RSV plus assay is intended as an aid in the diagnosis of influenza from Nasopharyngeal swab specimens and should not be used as a sole basis for treatment. Nasal washings and aspirates are unacceptable for Xpert Xpress SARS-CoV-2/FLU/RSV testing.  Fact Sheet for Patients: EntrepreneurPulse.com.au  Fact Sheet for Healthcare Providers: IncredibleEmployment.be  This test is not yet approved or cleared by the [place] FDA and has been authorized for detection and/or diagnosis of SARS-CoV-2 by FDA under an Emergency Use Authorization (EUA). This EUA will remain in effect (meaning this test can be used) for the duration of the COVID-19 declaration under Section 564(b)(1) of the Act, 21 U.S.C. section 360bbb-3(b)(1), unless the authorization is terminated or revoked.  Performed at [facility], [address], [place], [place] [address]   Urine Culture     Status: Abnormal   Collection Time: 04/14/21  1:29 AM   Specimen: Urine, Clean Catch  Result Value Ref Range Status   Specimen Description   Final    URINE, CLEAN CATCH Performed at [facility], [address] [place], [place] [address]    Special Requests   Final    NONE Performed at [facility], [address] [place], [place] [address]  Culture >=100,000 COLONIES/mL ESCHERICHIA COLI (A)  Final   Report  Status 04/16/2021 FINAL  Final   Organism ID, Bacteria ESCHERICHIA COLI (A)  Final      Susceptibility   Escherichia coli - MIC*    AMPICILLIN <=2 SENSITIVE Sensitive     CEFAZOLIN <=4 SENSITIVE Sensitive     CEFEPIME <=0.12 SENSITIVE Sensitive     CEFTRIAXONE <=0.25 SENSITIVE Sensitive     CIPROFLOXACIN <=0.25 SENSITIVE Sensitive     GENTAMICIN <=1 SENSITIVE Sensitive     IMIPENEM <=0.25 SENSITIVE Sensitive     NITROFURANTOIN <=16 SENSITIVE Sensitive     TRIMETH/SULFA <=20 SENSITIVE Sensitive     AMPICILLIN/SULBACTAM <=2 SENSITIVE Sensitive     PIP/TAZO <=4 SENSITIVE Sensitive     * >=100,000 COLONIES/mL ESCHERICHIA COLI  Urine Culture     Status: Abnormal   Collection Time: 04/14/21  1:29 AM   Specimen: Urine, Clean Catch  Result Value Ref Range Status   Specimen Description   Final    URINE, CLEAN CATCH Performed at [facility], [address] [place], [place] [address]    Special Requests   Final    NONE Performed at [facility], [address] [place], [place] [address]    Culture >=100,000 COLONIES/mL ESCHERICHIA COLI (A)  Final   Report Status 04/16/2021 FINAL  Final   Organism ID, Bacteria ESCHERICHIA COLI (A)  Final      Susceptibility   Escherichia coli - MIC*    AMPICILLIN <=2 SENSITIVE Sensitive     CEFAZOLIN <=4 SENSITIVE Sensitive     CEFEPIME <=0.12 SENSITIVE Sensitive     CEFTRIAXONE <=0.25 SENSITIVE Sensitive     CIPROFLOXACIN <=0.25 SENSITIVE Sensitive     GENTAMICIN <=1 SENSITIVE Sensitive     IMIPENEM <=0.25 SENSITIVE Sensitive     NITROFURANTOIN <=16 SENSITIVE Sensitive     TRIMETH/SULFA <=20 SENSITIVE Sensitive     AMPICILLIN/SULBACTAM <=2 SENSITIVE Sensitive     PIP/TAZO <=4 SENSITIVE Sensitive     * >=100,000 COLONIES/mL ESCHERICHIA COLI  Blood culture (routine x 2)     Status: None (Preliminary result)   Collection Time: 04/14/21  2:35 AM   Specimen: BLOOD  Result Value Ref Range Status   Specimen Description BLOOD FA   Final   Special Requests   Final    BOTTLES DRAWN AEROBIC AND ANAEROBIC Blood Culture adequate volume   Culture   Final    NO GROWTH 2 DAYS Performed at [facility], [address] [place], [place] [address]    Report Status PENDING  Incomplete  Blood culture (routine x 2)     Status: None (Preliminary result)   Collection Time: 04/14/21  2:35 AM   Specimen: BLOOD  Result Value Ref Range Status   Specimen Description BLOOD LAC  Final   Special Requests   Final    BOTTLES DRAWN AEROBIC AND ANAEROBIC Blood Culture adequate volume   Culture   Final    NO GROWTH 2 DAYS Performed at [facility], [address] [place], [place] [address]    Report Status PENDING  Incomplete  CSF culture w Stat Gram Stain     Status: None (Preliminary result)   Collection Time: 04/14/21  3:35 AM   Specimen: CSF; Cerebrospinal Fluid  Result Value Ref Range Status   Specimen Description   Final    CSF Performed at [facility], [address] [place],  [address]    Special Requests  Final    NONE Performed at [facility], [address] [place], [place] [address]    Gram Stain   Final    NO ORGANISMS SEEN WBCS SEEN NO RBC SEEN CRITICAL RESULT CALLED TO, READ BACK BY AND VERIFIED WITH: [place] JAMES RN AT 3403 04/14/21 GAA Performed at [facility], [address] [place], [place] [address]    Culture   Final    NO GROWTH 2 DAYS Performed at [facility], [place] [address], [place], [place] [address]    Report Status PENDING  Incomplete     Time coordinating discharge: Over 30 minutes  SIGNED:   Wyvonnia Dusky, MD  [facility] 04/16/2021, 3:14 PM Pager   If 7PM-7AM, please contact night-coverage

## 2021-04-17 LAB — CSF CULTURE W GRAM STAIN
Culture: NO GROWTH
Gram Stain: NONE SEEN

## 2021-04-19 LAB — CULTURE, BLOOD (ROUTINE X 2)
Culture: NO GROWTH
Culture: NO GROWTH
Special Requests: ADEQUATE
Special Requests: ADEQUATE

## 2021-04-20 LAB — QUANTIFERON-TB GOLD PLUS (RQFGPL)
QuantiFERON Mitogen Value: 3.98 IU/mL
QuantiFERON Nil Value: 0.3 IU/mL
QuantiFERON TB1 Ag Value: 0.68 IU/mL
QuantiFERON TB2 Ag Value: 0.57 IU/mL

## 2021-04-20 LAB — QUANTIFERON-TB GOLD PLUS: QuantiFERON-TB Gold Plus: POSITIVE — AB

## 2021-04-29 ENCOUNTER — Ambulatory Visit: Payer: Medicaid Other | Attending: Infectious Diseases | Admitting: Infectious Diseases

## 2021-04-29 ENCOUNTER — Encounter: Payer: Self-pay | Admitting: Infectious Diseases

## 2021-04-29 ENCOUNTER — Other Ambulatory Visit: Payer: Self-pay

## 2021-04-29 ENCOUNTER — Other Ambulatory Visit
Admission: RE | Admit: 2021-04-29 | Discharge: 2021-04-29 | Disposition: A | Discharge status: Home / Self Care | Payer: Medicaid Other | Attending: Infectious Diseases | Admitting: Infectious Diseases

## 2021-04-29 VITALS — BP 118/81 | HR 83 | Resp 16 | Ht 63.0 in | Wt 190.4 lb

## 2021-04-29 DIAGNOSIS — Z87440 Personal history of urinary (tract) infections: Secondary | ICD-10-CM | POA: Diagnosis not present

## 2021-04-29 DIAGNOSIS — Z7984 Long term (current) use of oral hypoglycemic drugs: Secondary | ICD-10-CM | POA: Diagnosis not present

## 2021-04-29 DIAGNOSIS — E119 Type 2 diabetes mellitus without complications: Secondary | ICD-10-CM | POA: Diagnosis not present

## 2021-04-29 DIAGNOSIS — Z79899 Other long term (current) drug therapy: Secondary | ICD-10-CM | POA: Diagnosis not present

## 2021-04-29 DIAGNOSIS — N12 Tubulo-interstitial nephritis, not specified as acute or chronic: Secondary | ICD-10-CM

## 2021-04-29 DIAGNOSIS — R7612 Nonspecific reaction to cell mediated immunity measurement of gamma interferon antigen response without active tuberculosis: Secondary | ICD-10-CM | POA: Diagnosis present

## 2021-04-29 LAB — COMPREHENSIVE METABOLIC PANEL
ALT: 13 U/L (ref 0–44)
AST: 14 U/L — ABNORMAL LOW (ref 15–41)
Albumin: 4 g/dL (ref 3.5–5.0)
Alkaline Phosphatase: 51 U/L (ref 38–126)
Anion gap: 7 (ref 5–15)
BUN: 12 mg/dL (ref 6–20)
CO2: 24 mmol/L (ref 22–32)
Calcium: 8.9 mg/dL (ref 8.9–10.3)
Chloride: 103 mmol/L (ref 98–111)
Creatinine, Ser: 0.61 mg/dL (ref 0.44–1.00)
GFR, Estimated: >60 mL/min (ref >60)
Glucose, Bld: 92 mg/dL (ref 70–99)
Potassium: 3.9 mmol/L (ref 3.5–5.1)
Sodium: 134 mmol/L — ABNORMAL LOW (ref 135–145)
Total Bilirubin: 1.1 mg/dL (ref 0.3–1.2)
Total Protein: 7 g/dL (ref 6.5–8.1)

## 2021-04-29 LAB — HEPATITIS C ANTIBODY: HCV Ab: NONREACTIVE

## 2021-04-29 LAB — HEPATITIS B SURFACE ANTIGEN: Hepatitis B Surface Ag: NONREACTIVE

## 2021-04-29 LAB — HEPATITIS A ANTIBODY, TOTAL: hep A Total Ab: REACTIVE — AB

## 2021-04-29 MED ORDER — RIFAMPIN 300 MG PO CAPS: 600.0000 mg | ORAL_CAPSULE | Freq: Every day | ORAL | 1 refills | Status: AC

## 2021-04-29 NOTE — Patient Instructions (Addendum)
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 Today will ssend  prescription for rifampin 300mg  X 2=600mg   which you will take 2 in the morning  Watch out for any side effects including yellowing of eyes, pain abdomen, nausea and vomiting which is severe Follow up 1 month but call the clinic or walk in ( thue/Thursday ) if you have any of the above symptoms

## 2021-04-29 NOTE — Progress Notes (Addendum)
NAME: Summer Johnson  DOB: 06-20-1980  MRN: [id number]  Date/Time: 04/29/2021 11:36 AM   Subjective:   ?Here in the room with spanish interpreter  Summer Johnson is a 40 y.o. 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 No known TB exposure She says she is ready to start treatment for the positive test- Her recent CXR when she was hospitalized is normal. She had borderline high sugar and was given metformin She has her PCP at [facility] and gets her meds from that pharmacy.     Past Medical History:  Diagnosis Date   Gestational diabetes mellitus    Positive QuantiFERON-TB Gold test     Past Surgical History:  Procedure Laterality Date   CESAREAN SECTION      Social History   Socioeconomic History   Marital status: Married    Spouse name: Richrd Prime   Number of children: 3   Years of education: Not on file   Highest education level: 8th grade  Occupational History   Occupation: Best boy   Tobacco Use   Smoking status: Never   Smokeless tobacco: Never  Vaping Use   Vaping Use: Never used  Substance and Sexual Activity   Alcohol use: Never   Drug use: Never   Sexual activity: Yes  Other Topics Concern   Not on file  Social History Narrative   Not on file   Social Determinants of Health   Financial Resource Strain: Not on file  Food Insecurity: Not on file  Transportation Needs: Not on file  Physical Activity: Not on file  Stress: Not on file  Social Connections: Not on  file  Intimate Partner Violence: Not on file    Family History  Problem Relation Age of Onset   Hypertension Mother    Diabetes Mother    Diabetes Sister    No Known Allergies I? Current Outpatient Medications  Medication Sig Dispense Refill   metFORMIN (GLUCOPHAGE) 500 MG tablet Take 1 tablet by mouth 2 (two) times daily.     cefadroxil (DURICEF) 500 MG capsule Take 1,000 mg by mouth 2 (two) times daily. (Patient not taking: Reported on 04/29/2021)     No current facility-administered medications for this visit.     Abtx:  Anti-infectives (From admission, onward)    None       REVIEW OF SYSTEMS:  Const: negative fever, negative chills, negative weight loss Eyes: negative diplopia or visual changes, negative eye pain ENT: negative coryza, negative sore throat Resp: negative cough, hemoptysis, dyspnea Cards: negative for chest pain, palpitations, lower extremity edema GU: negative for frequency, dysuria and hematuria GI: Negative for abdominal pain, diarrhea, bleeding, constipation Skin: negative for rash and pruritus Heme: negative for easy bruising and gum/nose bleeding MS: negative for myalgias, arthralgias, back pain and muscle weakness Neurolo:negative for headaches, dizziness, vertigo, memory problems  Psych: negative for feelings of anxiety, depression  Endocrine: negative for thyroid, diabetes Allergy/Immunology- negative for any medication or food allergies ?  Objective:  VITALS:  Resp 16   Ht 5\' 3"  (1.6 m)   Wt 190 lb 6.4 oz (86.4 kg)   BMI 33.73 kg/m  PHYSICAL EXAM:  General: Alert, cooperative, no distress, appears stated age.  Head: Normocephalic, without obvious abnormality, atraumatic. Eyes: Conjunctivae clear, anicteric sclerae. Pupils are equal ENT Nares normal. No drainage or sinus tenderness. Lips, mucosa, and tongue normal. No Thrush Neck: Supple, symmetrical, no adenopathy, thyroid: non tender no carotid bruit and no JVD. Back: No CVA  tenderness. Lungs: Clear to auscultation bilaterally. No Wheezing or Rhonchi. No rales. Heart: Regular rate and rhythm, no murmur, rub or gallop. Abdomen: Soft, non-tender,not distended. Bowel sounds normal. No masses Extremities: atraumatic, no cyanosis. No edema. No clubbing Skin: No rashes or lesions. Or bruising Lymph: Cervical, supraclavicular normal. Neurologic: Grossly non-focal Pertinent Labs Lab Results CBC    Component Value Date/Time   WBC 13.0 (H) 04/15/2021 0316   RBC 3.46 (L) 04/15/2021 0316   HGB 9.1 (L) 04/15/2021 0316   HGB 12.1 09/19/2018 0000   HCT 28.8 (L) 04/15/2021 0316   PLT 233 04/15/2021 0316   PLT 293 09/19/2018 0000   MCV 83.2 04/15/2021 0316   MCH 26.3 04/15/2021 0316   MCHC 31.6 04/15/2021 0316   RDW 15.7 (H) 04/15/2021 0316   LYMPHSABS 0.9 04/14/2021 0129   MONOABS 1.6 (H) 04/14/2021 0129   EOSABS 0.0 04/14/2021 0129   BASOSABS 0.0 04/14/2021 0129    CMP Latest Ref Rng & Units 04/15/2021 04/14/2021  Glucose 70 - 99 mg/dL 04/16/2021) 578(I)  BUN 6 - 20 mg/dL 11 13  Creatinine 696(E - 1.00 mg/dL 9.52 8.41)  Sodium 3.24(M - 145 mmol/L 138 137  Potassium 3.5 - 5.1 mmol/L 3.8 3.8  Chloride 98 - 111 mmol/L 113(H) 104  CO2 22 - 32 mmol/L 19(L) 21(L)  Calcium 8.9 - 10.3 mg/dL 7.6(L) 8.1(L)  Total Protein 6.5 - 8.1 g/dL - 6.8  Total Bilirubin 0.3 - 1.2 mg/dL - 1.1  Alkaline Phos 38 - 126 U/L - 62  AST 15 - 41 U/L - 22  ALT 0 - 44 U/L - 28      Microbiology: No results found for this or any previous visit (from the past 240 hour(s)).  IMAGING RESULTS:  I have personally reviewed the films ? Impression/Recommendation ? ?UTI with pylonephritis on the rt - has resolved and she has completed the full course of antibioitic  Positive quantiferon gold- neg CXR, no symptoms. She will start Rifampin 600mg  Po X 4 months Described the side effects and warning signs Follow up 4 weeks N Drug interaction with metformin  DM- on metformin  Discussed the  management with the patient in great detail thru the interpreter? ___________________________________________________ Discussed with patient, requesting provider Note:  This document was prepared using Dragon voice recognition software and may include unintentional dictation errors.

## 2021-04-30 LAB — HEPATITIS B SURFACE ANTIBODY, QUANTITATIVE: Hep B S AB Quant (Post): <3.1 m[IU]/mL — ABNORMAL LOW (ref >9.9)

## 2021-05-01 ENCOUNTER — Ambulatory Visit (LOCAL_COMMUNITY_HEALTH_CENTER): Payer: Medicaid Other

## 2021-05-01 VITALS — Wt 180.0 lb

## 2021-05-01 DIAGNOSIS — R7612 Nonspecific reaction to cell mediated immunity measurement of gamma interferon antigen response without active tuberculosis: Secondary | ICD-10-CM | POA: Diagnosis not present

## 2021-05-01 NOTE — Progress Notes (Signed)
Patient has had +QFT in 2020, declined treatment at that time due to pregnancy. Was recently hospitalized for sepsis 2/2 pyelonephritis and had another +QFT and was seen by ID. 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  Patient was initially seen by Dr. Rivka Safer while in the hospital recently and Dr. Elvera Lennox prescribed LTBI meds and a follow up appointment in Jan 2023 for management of 4 months of treatment. When spoke to pt on the phone, pt completed the EPI intake and we had scheduled an appointment but then the call was dropped shortly after scheduling the appt for follow up. In the meantime, Dr R requested [facility] take over her LTBI management. Will contact patient to see if she filled her rx for rifampin and started treatment and will schedule follow up accordingly.  Jennye Moccasin, MD

## 2021-05-02 ENCOUNTER — Telehealth: Payer: Self-pay | Admitting: Surgery

## 2021-05-02 NOTE — Telephone Encounter (Signed)
Patient needs to be informed of tx of care to [facility] TB program from ID doc.  Need to confirm whether or not patient has obtained 1st months supply of Rifampin from Dr. Rivka Safer  Need to schedule appt for follow up, already has appointment for next Friday 12/16 if she still needs meds. If she does not need meds, push appointment out 1 month for med mgmt.   Jennye Moccasin, MD

## 2021-05-05 ENCOUNTER — Telehealth: Payer: Self-pay | Admitting: Surgery

## 2021-05-05 NOTE — Telephone Encounter (Signed)
Trying to determine if pt has filled rx for LTBI meds prescribed by Dr. Rivka Safer. Dr. Elvera Lennox would prefer LTBI mgmt be transferred to [facility] but pt no longer picking up phone to confirm appt or follow up on med management. Does have appt scheduled with [facility] for this Friday 12/16.  If pt has picked up meds (rifampin prescribed by Dr. Rivka Safer) she does have a 1 month follow up appt with Dr. Rivka Safer in January 2023. Pt can continue with Dr. Elvera Lennox if she prefers. Will continue to try and reach pt to determine what she would like to do.   Jennye Moccasin, MD

## 2021-05-07 ENCOUNTER — Telehealth: Payer: Self-pay | Admitting: Surgery

## 2021-05-07 NOTE — Telephone Encounter (Signed)
Patient has not picked up after multiple calls regarding tx for LTBI. Patient has appt in January with ID and was prescribed TB meds by Dr. Dayle Points. Presumably patient wants to continue her LTBI tx w Dr. Elvera Lennox since she will not pick up phone from health department.   Closed to TB follow up from [facility].   Jennye Moccasin, MD

## 2021-06-03 ENCOUNTER — Ambulatory Visit: Payer: Medicaid Other | Admitting: Infectious Diseases

## 2021-06-04 ENCOUNTER — Telehealth: Payer: Self-pay

## 2021-06-04 NOTE — Telephone Encounter (Signed)
Using Interp. Line-called to see if patient has seen [facility] yet. Did she start Rifampin?  VM full. Will try again tomorrow.

## 2023-03-13 IMAGING — CR DG CHEST 2V
2 series · 2 of 2 positions shown · non-contrast
Comparison: 10/11/2018

CLINICAL DATA: Fever and chills for several days, initial encounter

EXAM:
CHEST - 2 VIEW

[chest lat]
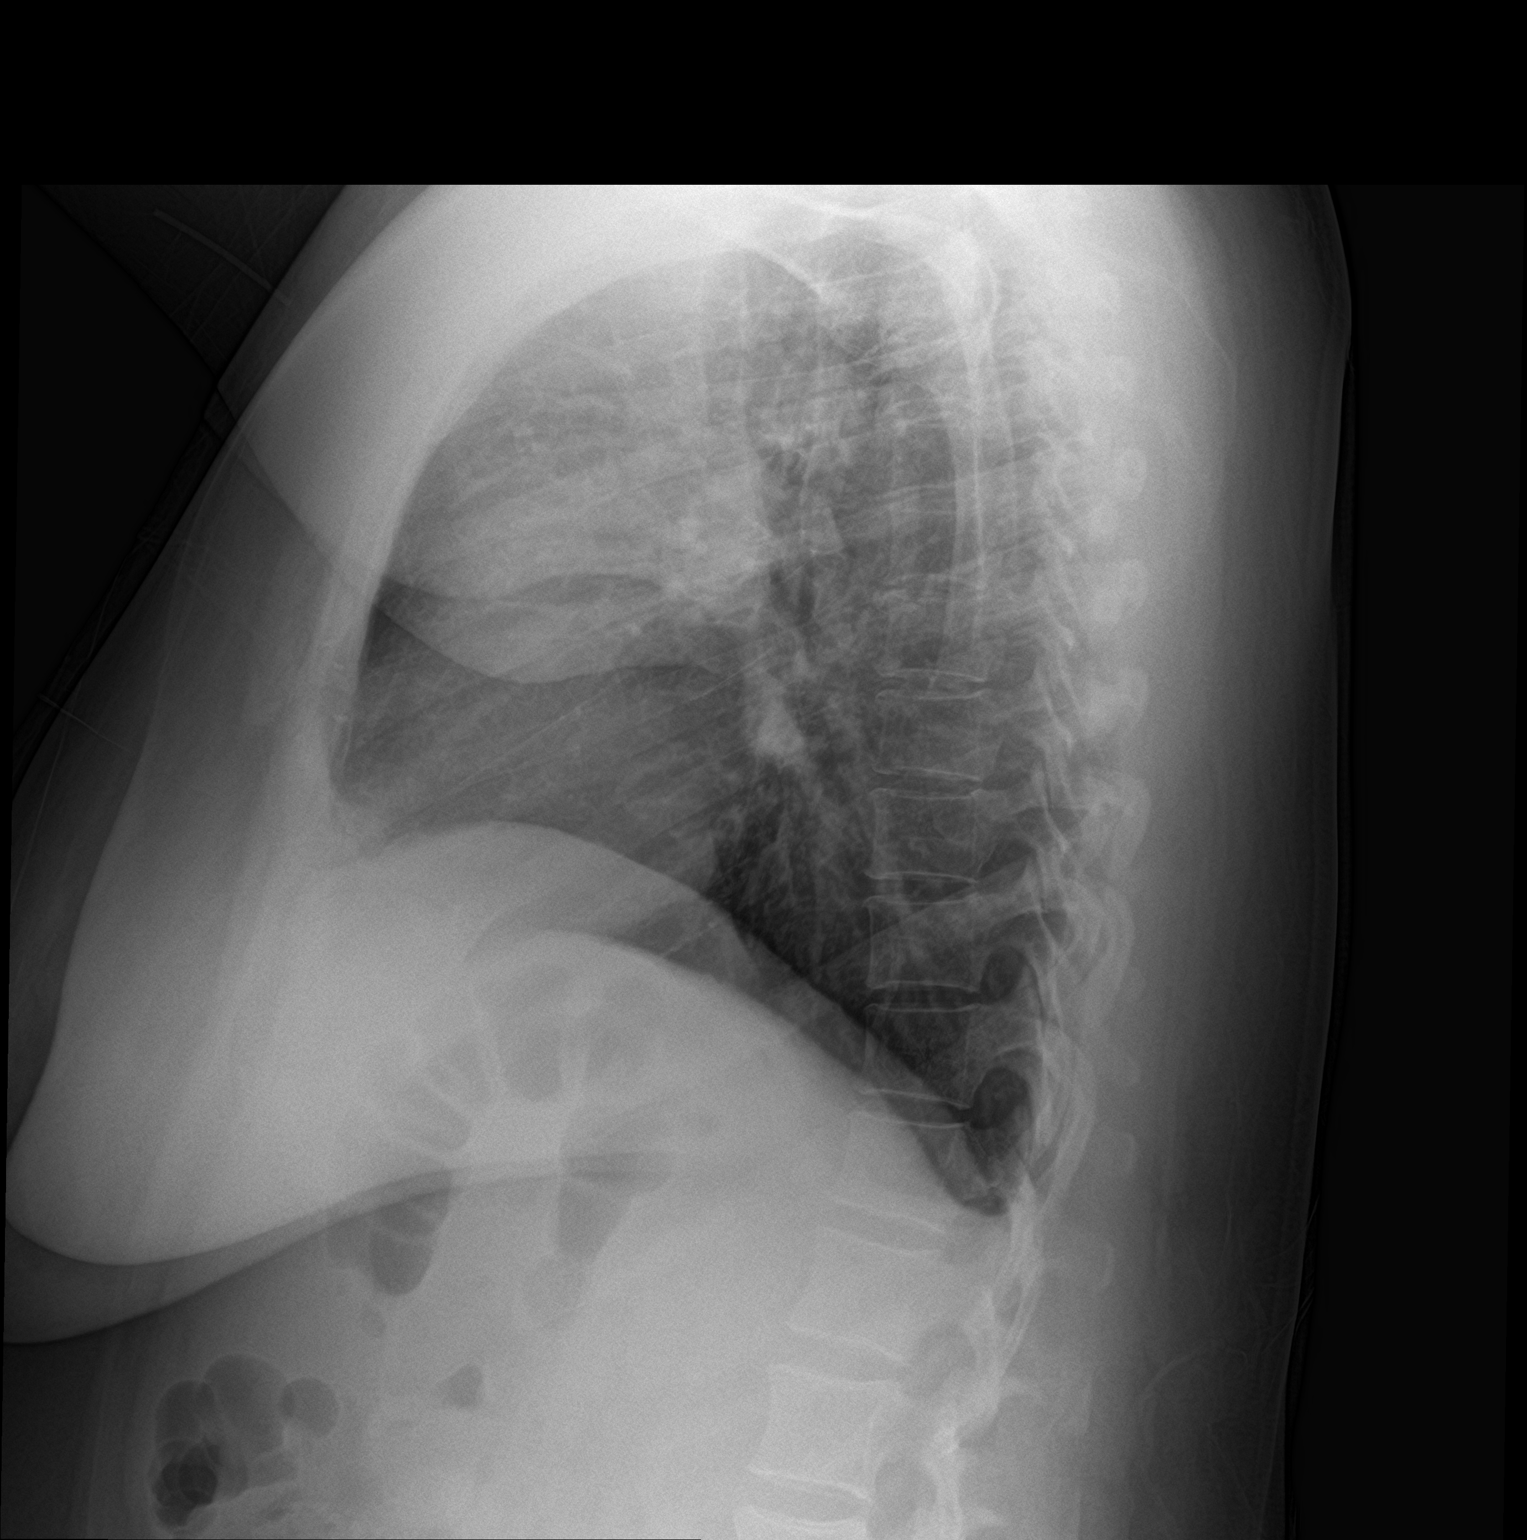

[chest pa]
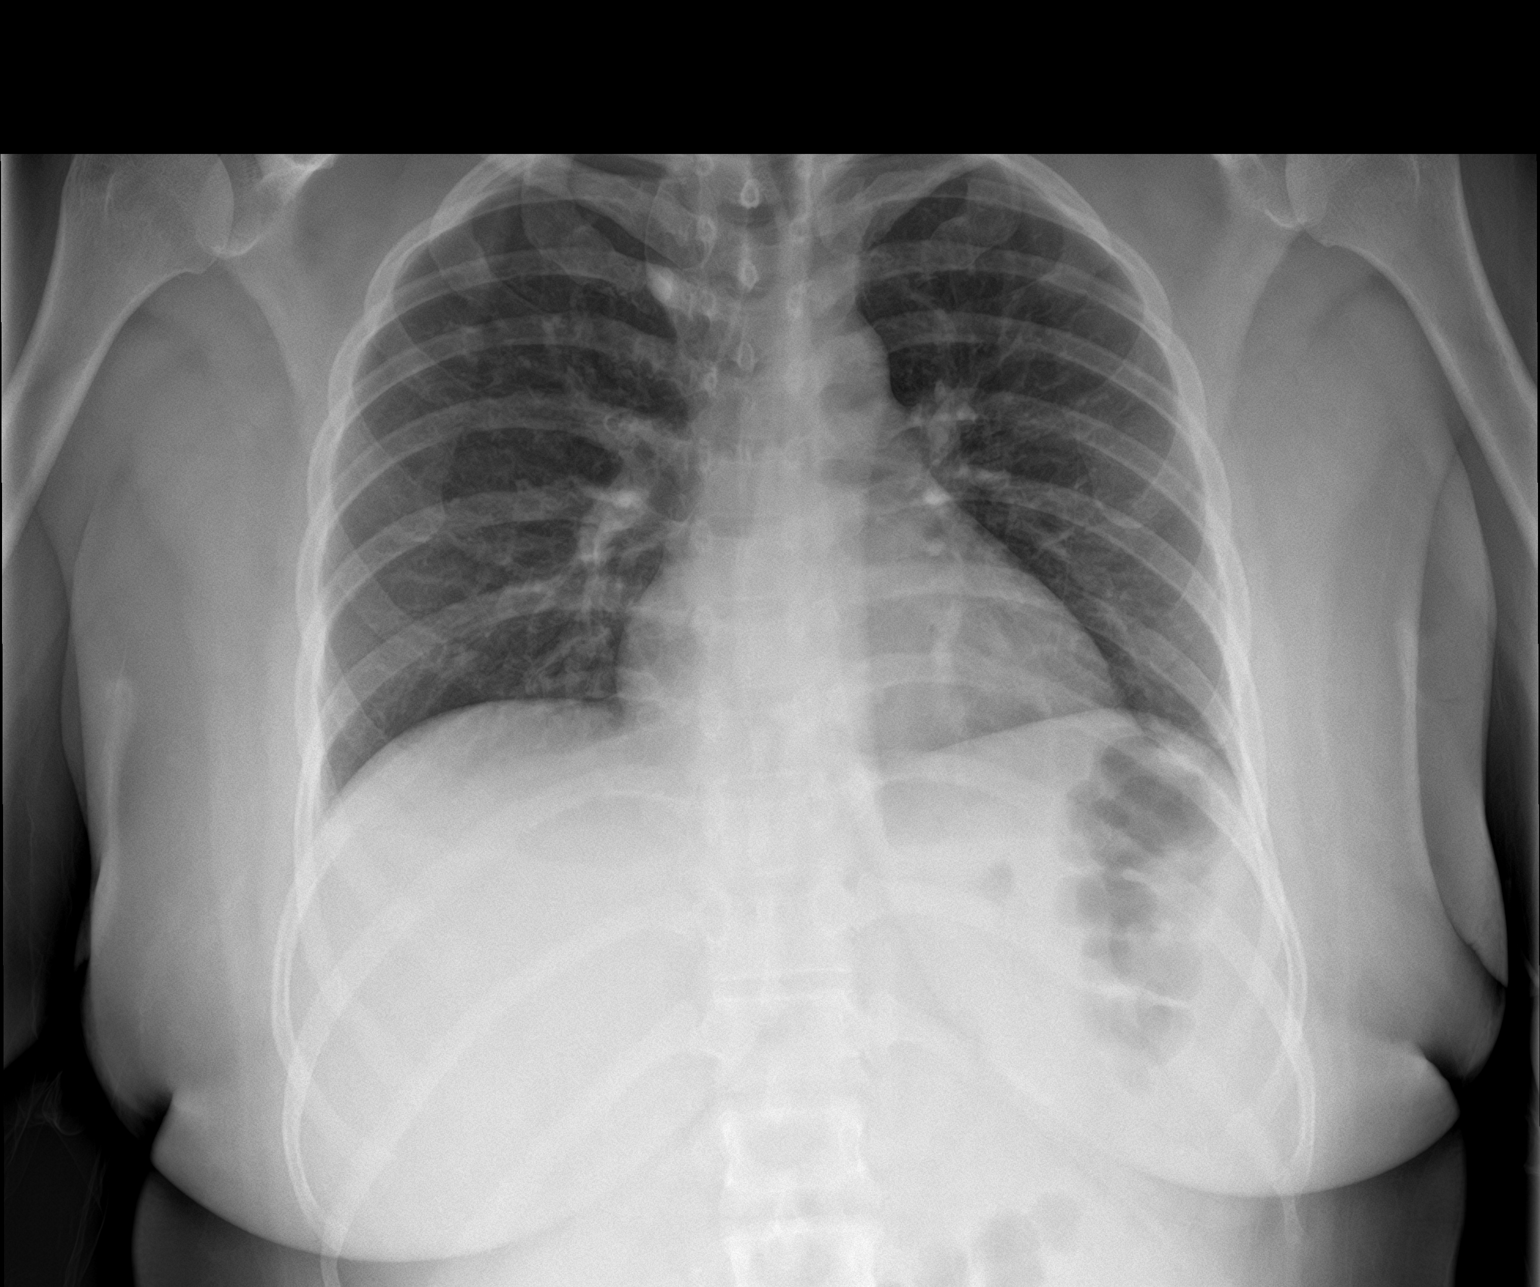

[2 of 2 positions shown; findings below may reference images not displayed]

FINDINGS: The heart size and mediastinal contours are within normal limits.
Both lungs are clear. The visualized skeletal structures are
unremarkable.
IMPRESSION: No active cardiopulmonary disease.

## 2023-03-31 ENCOUNTER — Other Ambulatory Visit: Payer: Self-pay | Admitting: Nurse Practitioner

## 2023-03-31 DIAGNOSIS — Z1231 Encounter for screening mammogram for malignant neoplasm of breast: Secondary | ICD-10-CM

## 2024-01-10 ENCOUNTER — Other Ambulatory Visit: Payer: Self-pay

## 2024-01-10 DIAGNOSIS — Z1231 Encounter for screening mammogram for malignant neoplasm of breast: Secondary | ICD-10-CM

## 2024-02-08 ENCOUNTER — Encounter
